# Patient Record
Sex: Male | Born: 1960 | Race: White | Hispanic: No | Marital: Married | Smoking: Former smoker
Health system: Southern US, Community
[De-identification: ages and names within clinical notes are randomized; demographics above are authoritative.]

## PROBLEM LIST (undated history)

## (undated) DIAGNOSIS — Z9889 Other specified postprocedural states: Secondary | ICD-10-CM

## (undated) DIAGNOSIS — E785 Hyperlipidemia, unspecified: Secondary | ICD-10-CM

## (undated) DIAGNOSIS — R112 Nausea with vomiting, unspecified: Secondary | ICD-10-CM

## (undated) DIAGNOSIS — K219 Gastro-esophageal reflux disease without esophagitis: Secondary | ICD-10-CM

## (undated) DIAGNOSIS — I2510 Atherosclerotic heart disease of native coronary artery without angina pectoris: Secondary | ICD-10-CM

## (undated) DIAGNOSIS — I219 Acute myocardial infarction, unspecified: Secondary | ICD-10-CM

## (undated) DIAGNOSIS — I10 Essential (primary) hypertension: Secondary | ICD-10-CM

## (undated) HISTORY — DX: Acute myocardial infarction, unspecified: I21.9

## (undated) HISTORY — DX: Hyperlipidemia, unspecified: E78.5

## (undated) HISTORY — PX: CARDIAC CATHETERIZATION: SHX172

## (undated) HISTORY — DX: Essential (primary) hypertension: I10

---

## 2019-09-19 ENCOUNTER — Inpatient Hospital Stay (HOSPITAL_COMMUNITY): Payer: 59 | Admitting: Anesthesiology

## 2019-09-19 ENCOUNTER — Encounter (HOSPITAL_COMMUNITY)
Admission: EM | Disposition: A | Discharge status: Home / Self Care | Payer: Self-pay | Source: Home / Self Care | Attending: Cardiothoracic Surgery

## 2019-09-19 ENCOUNTER — Inpatient Hospital Stay (HOSPITAL_COMMUNITY)
Admission: EM | Admit: 2019-09-19 | Discharge: 2019-09-24 | DRG: 233 | Disposition: A | Discharge status: Home / Self Care | Payer: 59 | Attending: Cardiothoracic Surgery | Admitting: Cardiothoracic Surgery

## 2019-09-19 ENCOUNTER — Emergency Department (HOSPITAL_COMMUNITY): Payer: 59

## 2019-09-19 ENCOUNTER — Other Ambulatory Visit: Payer: Self-pay

## 2019-09-19 ENCOUNTER — Inpatient Hospital Stay (HOSPITAL_COMMUNITY): Payer: 59

## 2019-09-19 ENCOUNTER — Encounter (HOSPITAL_COMMUNITY): Payer: Self-pay | Admitting: *Deleted

## 2019-09-19 DIAGNOSIS — I25110 Atherosclerotic heart disease of native coronary artery with unstable angina pectoris: Secondary | ICD-10-CM

## 2019-09-19 DIAGNOSIS — R030 Elevated blood-pressure reading, without diagnosis of hypertension: Secondary | ICD-10-CM | POA: Diagnosis present

## 2019-09-19 DIAGNOSIS — I213 ST elevation (STEMI) myocardial infarction of unspecified site: Secondary | ICD-10-CM

## 2019-09-19 DIAGNOSIS — I2510 Atherosclerotic heart disease of native coronary artery without angina pectoris: Secondary | ICD-10-CM | POA: Diagnosis present

## 2019-09-19 DIAGNOSIS — E7800 Pure hypercholesterolemia, unspecified: Secondary | ICD-10-CM | POA: Diagnosis present

## 2019-09-19 DIAGNOSIS — I5021 Acute systolic (congestive) heart failure: Secondary | ICD-10-CM | POA: Diagnosis present

## 2019-09-19 DIAGNOSIS — Z8249 Family history of ischemic heart disease and other diseases of the circulatory system: Secondary | ICD-10-CM

## 2019-09-19 DIAGNOSIS — Z20822 Contact with and (suspected) exposure to covid-19: Secondary | ICD-10-CM | POA: Diagnosis present

## 2019-09-19 DIAGNOSIS — Z9689 Presence of other specified functional implants: Secondary | ICD-10-CM

## 2019-09-19 DIAGNOSIS — Z885 Allergy status to narcotic agent status: Secondary | ICD-10-CM | POA: Diagnosis not present

## 2019-09-19 DIAGNOSIS — D62 Acute posthemorrhagic anemia: Secondary | ICD-10-CM | POA: Diagnosis not present

## 2019-09-19 DIAGNOSIS — E669 Obesity, unspecified: Secondary | ICD-10-CM | POA: Diagnosis present

## 2019-09-19 DIAGNOSIS — E1165 Type 2 diabetes mellitus with hyperglycemia: Secondary | ICD-10-CM | POA: Diagnosis present

## 2019-09-19 DIAGNOSIS — I2102 ST elevation (STEMI) myocardial infarction involving left anterior descending coronary artery: Secondary | ICD-10-CM | POA: Diagnosis present

## 2019-09-19 DIAGNOSIS — Z951 Presence of aortocoronary bypass graft: Secondary | ICD-10-CM

## 2019-09-19 DIAGNOSIS — I2109 ST elevation (STEMI) myocardial infarction involving other coronary artery of anterior wall: Secondary | ICD-10-CM | POA: Diagnosis not present

## 2019-09-19 DIAGNOSIS — R739 Hyperglycemia, unspecified: Secondary | ICD-10-CM | POA: Diagnosis not present

## 2019-09-19 DIAGNOSIS — Z09 Encounter for follow-up examination after completed treatment for conditions other than malignant neoplasm: Secondary | ICD-10-CM

## 2019-09-19 HISTORY — PX: CORONARY ARTERY BYPASS GRAFT: SHX141

## 2019-09-19 HISTORY — PX: IABP INSERTION: CATH118242

## 2019-09-19 HISTORY — PX: TEE WITHOUT CARDIOVERSION: SHX5443

## 2019-09-19 HISTORY — PX: CORONARY BALLOON ANGIOPLASTY: CATH118233

## 2019-09-19 HISTORY — PX: LEFT HEART CATH AND CORONARY ANGIOGRAPHY: CATH118249

## 2019-09-19 HISTORY — PX: ENDOVEIN HARVEST OF GREATER SAPHENOUS VEIN: SHX5059

## 2019-09-19 LAB — CBC
HCT: 48.9 % (ref 39.0–52.0)
HCT: 36.6 % — ABNORMAL LOW (ref 39.0–52.0)
HCT: 33.8 % — ABNORMAL LOW (ref 39.0–52.0)
Hemoglobin: 16.1 g/dL (ref 13.0–17.0)
Hemoglobin: 12.4 g/dL — ABNORMAL LOW (ref 13.0–17.0)
Hemoglobin: 11.5 g/dL — ABNORMAL LOW (ref 13.0–17.0)
MCH: 30.3 pg (ref 26.0–34.0)
MCH: 31.2 pg (ref 26.0–34.0)
MCH: 31.3 pg (ref 26.0–34.0)
MCHC: 32.9 g/dL (ref 30.0–36.0)
MCHC: 33.9 g/dL (ref 30.0–36.0)
MCHC: 34 g/dL (ref 30.0–36.0)
MCV: 91.9 fL (ref 80.0–100.0)
MCV: 92.2 fL (ref 80.0–100.0)
MCV: 91.8 fL (ref 80.0–100.0)
Platelets: 301 10*3/uL (ref 150–400)
Platelets: 173 10*3/uL (ref 150–400)
Platelets: 144 10*3/uL — ABNORMAL LOW (ref 150–400)
RBC: 5.32 MIL/uL (ref 4.22–5.81)
RBC: 3.97 MIL/uL — ABNORMAL LOW (ref 4.22–5.81)
RBC: 3.68 MIL/uL — ABNORMAL LOW (ref 4.22–5.81)
RDW: 12.3 % (ref 11.5–15.5)
RDW: 12.4 % (ref 11.5–15.5)
RDW: 12.5 % (ref 11.5–15.5)
WBC: 8.1 10*3/uL (ref 4.0–10.5)
WBC: 16.5 10*3/uL — ABNORMAL HIGH (ref 4.0–10.5)
WBC: 13 10*3/uL — ABNORMAL HIGH (ref 4.0–10.5)
nRBC: 0 % (ref 0.0–0.2)
nRBC: 0 % (ref 0.0–0.2)
nRBC: 0 % (ref 0.0–0.2)

## 2019-09-19 LAB — POCT I-STAT 7, (LYTES, BLD GAS, ICA,H+H)
Acid-base deficit: 4 mmol/L — ABNORMAL HIGH (ref 0.0–2.0)
Acid-base deficit: 4 mmol/L — ABNORMAL HIGH (ref 0.0–2.0)
Acid-base deficit: 3 mmol/L — ABNORMAL HIGH (ref 0.0–2.0)
Acid-base deficit: 4 mmol/L — ABNORMAL HIGH (ref 0.0–2.0)
Bicarbonate: 22.2 mmol/L (ref 20.0–28.0)
Bicarbonate: 21.9 mmol/L (ref 20.0–28.0)
Bicarbonate: 22.4 mmol/L (ref 20.0–28.0)
Bicarbonate: 21.5 mmol/L (ref 20.0–28.0)
Calcium, Ion: 1.09 mmol/L — ABNORMAL LOW (ref 1.15–1.40)
Calcium, Ion: 1.08 mmol/L — ABNORMAL LOW (ref 1.15–1.40)
Calcium, Ion: 1.05 mmol/L — ABNORMAL LOW (ref 1.15–1.40)
Calcium, Ion: 1.07 mmol/L — ABNORMAL LOW (ref 1.15–1.40)
HCT: 32 % — ABNORMAL LOW (ref 39.0–52.0)
HCT: 36 % — ABNORMAL LOW (ref 39.0–52.0)
HCT: 33 % — ABNORMAL LOW (ref 39.0–52.0)
HCT: 31 % — ABNORMAL LOW (ref 39.0–52.0)
Hemoglobin: 10.9 g/dL — ABNORMAL LOW (ref 13.0–17.0)
Hemoglobin: 12.2 g/dL — ABNORMAL LOW (ref 13.0–17.0)
Hemoglobin: 11.2 g/dL — ABNORMAL LOW (ref 13.0–17.0)
Hemoglobin: 10.5 g/dL — ABNORMAL LOW (ref 13.0–17.0)
O2 Saturation: 94 %
O2 Saturation: 98 %
O2 Saturation: 96 %
O2 Saturation: 97 %
Patient temperature: 36.9
Patient temperature: 36.9
Patient temperature: 37
Patient temperature: 37
Potassium: 4.3 mmol/L (ref 3.5–5.1)
Potassium: 3.9 mmol/L (ref 3.5–5.1)
Potassium: 4 mmol/L (ref 3.5–5.1)
Potassium: 3.6 mmol/L (ref 3.5–5.1)
Sodium: 139 mmol/L (ref 135–145)
Sodium: 141 mmol/L (ref 135–145)
Sodium: 142 mmol/L (ref 135–145)
Sodium: 141 mmol/L (ref 135–145)
TCO2: 24 mmol/L (ref 22–32)
TCO2: 23 mmol/L (ref 22–32)
TCO2: 24 mmol/L (ref 22–32)
TCO2: 23 mmol/L (ref 22–32)
pCO2 arterial: 43.1 mmHg (ref 32.0–48.0)
pCO2 arterial: 41.3 mmHg (ref 32.0–48.0)
pCO2 arterial: 42.5 mmHg (ref 32.0–48.0)
pCO2 arterial: 40.2 mmHg (ref 32.0–48.0)
pH, Arterial: 7.32 — ABNORMAL LOW (ref 7.350–7.450)
pH, Arterial: 7.331 — ABNORMAL LOW (ref 7.350–7.450)
pH, Arterial: 7.33 — ABNORMAL LOW (ref 7.350–7.450)
pH, Arterial: 7.337 — ABNORMAL LOW (ref 7.350–7.450)
pO2, Arterial: 76 mmHg — ABNORMAL LOW (ref 83.0–108.0)
pO2, Arterial: 115 mmHg — ABNORMAL HIGH (ref 83.0–108.0)
pO2, Arterial: 85 mmHg (ref 83.0–108.0)
pO2, Arterial: 102 mmHg (ref 83.0–108.0)

## 2019-09-19 LAB — LIPID PANEL
Cholesterol: 203 mg/dL — ABNORMAL HIGH (ref 0–200)
HDL: 43 mg/dL (ref >40)
LDL Cholesterol: 131 mg/dL — ABNORMAL HIGH (ref 0–99)
Total CHOL/HDL Ratio: 4.7 RATIO
Triglycerides: 146 mg/dL (ref <150)
VLDL: 29 mg/dL (ref 0–40)

## 2019-09-19 LAB — BASIC METABOLIC PANEL
Anion gap: 14 (ref 5–15)
Anion gap: 8 (ref 5–15)
BUN: 16 mg/dL (ref 6–20)
BUN: 12 mg/dL (ref 6–20)
CO2: 21 mmol/L — ABNORMAL LOW (ref 22–32)
CO2: 20 mmol/L — ABNORMAL LOW (ref 22–32)
Calcium: 9.1 mg/dL (ref 8.9–10.3)
Calcium: 7 mg/dL — ABNORMAL LOW (ref 8.9–10.3)
Chloride: 105 mmol/L (ref 98–111)
Chloride: 111 mmol/L (ref 98–111)
Creatinine, Ser: 1 mg/dL (ref 0.61–1.24)
Creatinine, Ser: 0.89 mg/dL (ref 0.61–1.24)
GFR calc Af Amer: >60 mL/min (ref >60)
GFR calc Af Amer: >60 mL/min (ref >60)
GFR calc non Af Amer: >60 mL/min (ref >60)
GFR calc non Af Amer: >60 mL/min (ref >60)
Glucose, Bld: 154 mg/dL — ABNORMAL HIGH (ref 70–99)
Glucose, Bld: 139 mg/dL — ABNORMAL HIGH (ref 70–99)
Potassium: 3.5 mmol/L (ref 3.5–5.1)
Potassium: 4.3 mmol/L (ref 3.5–5.1)
Sodium: 140 mmol/L (ref 135–145)
Sodium: 139 mmol/L (ref 135–145)

## 2019-09-19 LAB — TROPONIN I (HIGH SENSITIVITY): Troponin I (High Sensitivity): 41 ng/L — ABNORMAL HIGH (ref <18)

## 2019-09-19 LAB — TYPE AND SCREEN
ABO/RH(D): AB POS
Antibody Screen: NEGATIVE

## 2019-09-19 LAB — ABO/RH: ABO/RH(D): AB POS

## 2019-09-19 LAB — GLUCOSE, CAPILLARY
Glucose-Capillary: 150 mg/dL — ABNORMAL HIGH (ref 70–99)
Glucose-Capillary: 150 mg/dL — ABNORMAL HIGH (ref 70–99)
Glucose-Capillary: 145 mg/dL — ABNORMAL HIGH (ref 70–99)
Glucose-Capillary: 139 mg/dL — ABNORMAL HIGH (ref 70–99)
Glucose-Capillary: 144 mg/dL — ABNORMAL HIGH (ref 70–99)
Glucose-Capillary: 146 mg/dL — ABNORMAL HIGH (ref 70–99)
Glucose-Capillary: 131 mg/dL — ABNORMAL HIGH (ref 70–99)
Glucose-Capillary: 132 mg/dL — ABNORMAL HIGH (ref 70–99)
Glucose-Capillary: 143 mg/dL — ABNORMAL HIGH (ref 70–99)

## 2019-09-19 LAB — HEMOGLOBIN A1C
Hgb A1c MFr Bld: 6.3 % — ABNORMAL HIGH (ref 4.8–5.6)
Mean Plasma Glucose: 134.11 mg/dL

## 2019-09-19 LAB — MAGNESIUM: Magnesium: 2.8 mg/dL — ABNORMAL HIGH (ref 1.7–2.4)

## 2019-09-19 LAB — PROTIME-INR
INR: 1 (ref 0.8–1.2)
INR: 1.4 — ABNORMAL HIGH (ref 0.8–1.2)
Prothrombin Time: 12.5 seconds (ref 11.4–15.2)
Prothrombin Time: 17 seconds — ABNORMAL HIGH (ref 11.4–15.2)

## 2019-09-19 LAB — HEMOGLOBIN AND HEMATOCRIT, BLOOD
HCT: 25.3 % — ABNORMAL LOW (ref 39.0–52.0)
Hemoglobin: 8.5 g/dL — ABNORMAL LOW (ref 13.0–17.0)

## 2019-09-19 LAB — POCT ACTIVATED CLOTTING TIME: Activated Clotting Time: 263 seconds

## 2019-09-19 LAB — SARS CORONAVIRUS 2 BY RT PCR (HOSPITAL ORDER, PERFORMED IN ~~LOC~~ HOSPITAL LAB): SARS Coronavirus 2: NEGATIVE

## 2019-09-19 LAB — PLATELET COUNT: Platelets: 157 10*3/uL (ref 150–400)

## 2019-09-19 LAB — APTT
aPTT: 25 seconds (ref 24–36)
aPTT: 25 seconds (ref 24–36)

## 2019-09-19 SURGERY — CORONARY ARTERY BYPASS GRAFTING (CABG)
Anesthesia: General | Site: Leg Upper | Laterality: Right

## 2019-09-19 SURGERY — LEFT HEART CATH AND CORONARY ANGIOGRAPHY
Anesthesia: LOCAL

## 2019-09-19 MED ORDER — TRANEXAMIC ACID (OHS) PUMP PRIME SOLUTION
2.0000 mg/kg | INTRAVENOUS | Status: DC
  Filled 2019-09-19: qty 2.07

## 2019-09-19 MED ORDER — LACTATED RINGERS IV SOLN: INTRAVENOUS | Status: DC

## 2019-09-19 MED ORDER — SODIUM CHLORIDE 0.9% FLUSH
3.0000 mL | INTRAVENOUS | Status: DC | PRN
  Administered 2019-09-20: 3 mL via INTRAVENOUS

## 2019-09-19 MED ORDER — FENTANYL CITRATE (PF) 100 MCG/2ML IJ SOLN
INTRAMUSCULAR | Status: DC | PRN
  Administered 2019-09-19 (×2): 25 ug via INTRAVENOUS

## 2019-09-19 MED ORDER — SODIUM CHLORIDE 0.9 % IV SOLN
1.5000 g | INTRAVENOUS | Status: AC
  Administered 2019-09-19: 1.5 g via INTRAVENOUS
  Filled 2019-09-19: qty 1.5

## 2019-09-19 MED ORDER — HEPARIN (PORCINE) IN NACL 1000-0.9 UT/500ML-% IV SOLN
INTRAVENOUS | Status: AC
  Filled 2019-09-19: qty 500

## 2019-09-19 MED ORDER — ACETAMINOPHEN 160 MG/5ML PO SOLN: 650.0000 mg | Freq: Once | ORAL | Status: AC

## 2019-09-19 MED ORDER — MIDAZOLAM HCL 2 MG/2ML IJ SOLN
INTRAMUSCULAR | Status: AC
  Filled 2019-09-19: qty 2

## 2019-09-19 MED ORDER — VANCOMYCIN HCL 1500 MG/300ML IV SOLN
1500.0000 mg | INTRAVENOUS | Status: AC
  Administered 2019-09-19: 1500 mg via INTRAVENOUS
  Filled 2019-09-19: qty 300

## 2019-09-19 MED ORDER — HEPARIN (PORCINE) IN NACL 1000-0.9 UT/500ML-% IV SOLN
INTRAVENOUS | Status: DC | PRN
  Administered 2019-09-19 (×2): 500 mL

## 2019-09-19 MED ORDER — DEXMEDETOMIDINE HCL IN NACL 200 MCG/50ML IV SOLN
INTRAVENOUS | Status: AC
  Filled 2019-09-19: qty 50

## 2019-09-19 MED ORDER — SODIUM CHLORIDE 0.9 % IV SOLN: 250.0000 mL | INTRAVENOUS | Status: DC | PRN

## 2019-09-19 MED ORDER — NOREPINEPHRINE 4 MG/250ML-% IV SOLN
0.0000 ug/min | INTRAVENOUS | Status: DC
  Filled 2019-09-19: qty 250

## 2019-09-19 MED ORDER — FENTANYL CITRATE (PF) 100 MCG/2ML IJ SOLN
100.0000 ug | INTRAMUSCULAR | Status: DC | PRN
  Administered 2019-09-19 – 2019-09-20 (×10): 100 ug via INTRAVENOUS
  Filled 2019-09-19 (×10): qty 2

## 2019-09-19 MED ORDER — MILRINONE LACTATE IN DEXTROSE 20-5 MG/100ML-% IV SOLN
0.2000 ug/kg/min | INTRAVENOUS | Status: DC
  Administered 2019-09-19 – 2019-09-20 (×3): 0.3 ug/kg/min via INTRAVENOUS
  Administered 2019-09-21: 0.2 ug/kg/min via INTRAVENOUS
  Administered 2019-09-21: 0.3 ug/kg/min via INTRAVENOUS
  Filled 2019-09-19 (×5): qty 100

## 2019-09-19 MED ORDER — HEPARIN (PORCINE) IN NACL 1000-0.9 UT/500ML-% IV SOLN
INTRAVENOUS | Status: DC | PRN
  Administered 2019-09-19: 500 mL

## 2019-09-19 MED ORDER — SODIUM CHLORIDE 0.9 % IV SOLN
INTRAVENOUS | Status: DC
  Filled 2019-09-19: qty 30

## 2019-09-19 MED ORDER — LIDOCAINE HCL (PF) 1 % IJ SOLN
INTRAMUSCULAR | Status: AC
  Filled 2019-09-19: qty 30

## 2019-09-19 MED ORDER — SODIUM CHLORIDE 0.9% FLUSH
3.0000 mL | Freq: Two times a day (BID) | INTRAVENOUS | Status: DC
  Administered 2019-09-19 – 2019-09-20 (×2): 3 mL via INTRAVENOUS

## 2019-09-19 MED ORDER — NITROGLYCERIN 0.4 MG SL SUBL
SUBLINGUAL_TABLET | SUBLINGUAL | Status: AC
  Administered 2019-09-19: 0.4 mg via SUBLINGUAL
  Filled 2019-09-19: qty 1

## 2019-09-19 MED ORDER — METOPROLOL TARTRATE 5 MG/5ML IV SOLN: 2.5000 mg | INTRAVENOUS | Status: DC | PRN

## 2019-09-19 MED ORDER — ACETAMINOPHEN 500 MG PO TABS
1000.0000 mg | ORAL_TABLET | Freq: Four times a day (QID) | ORAL | Status: DC
  Administered 2019-09-20 – 2019-09-22 (×8): 1000 mg via ORAL
  Filled 2019-09-19 (×8): qty 2

## 2019-09-19 MED ORDER — SODIUM CHLORIDE 0.45 % IV SOLN: INTRAVENOUS | Status: DC | PRN

## 2019-09-19 MED ORDER — HEPARIN (PORCINE) IN NACL 1000-0.9 UT/500ML-% IV SOLN
INTRAVENOUS | Status: AC
  Filled 2019-09-19: qty 1000

## 2019-09-19 MED ORDER — SODIUM CHLORIDE 0.9 % IV SOLN: 250.0000 mL | INTRAVENOUS | Status: DC

## 2019-09-19 MED ORDER — MORPHINE SULFATE (PF) 2 MG/ML IV SOLN
1.0000 mg | INTRAVENOUS | Status: DC | PRN
  Administered 2019-09-19: 4 mg via INTRAVENOUS
  Administered 2019-09-19 (×2): 2 mg via INTRAVENOUS
  Filled 2019-09-19: qty 1
  Filled 2019-09-19: qty 2
  Filled 2019-09-19: qty 1

## 2019-09-19 MED ORDER — BISACODYL 5 MG PO TBEC
10.0000 mg | DELAYED_RELEASE_TABLET | Freq: Every day | ORAL | Status: DC
  Administered 2019-09-20 – 2019-09-21 (×2): 10 mg via ORAL
  Filled 2019-09-19 (×2): qty 2

## 2019-09-19 MED ORDER — ROCURONIUM BROMIDE 10 MG/ML (PF) SYRINGE
PREFILLED_SYRINGE | INTRAVENOUS | Status: AC
  Filled 2019-09-19: qty 10

## 2019-09-19 MED ORDER — ACETAMINOPHEN 650 MG RE SUPP
650.0000 mg | Freq: Once | RECTAL | Status: AC
  Administered 2019-09-19: 650 mg via RECTAL

## 2019-09-19 MED ORDER — MIDAZOLAM HCL (PF) 5 MG/ML IJ SOLN
INTRAMUSCULAR | Status: DC | PRN
  Administered 2019-09-19 (×2): 2 mg via INTRAVENOUS

## 2019-09-19 MED ORDER — METOPROLOL TARTRATE 12.5 MG HALF TABLET
12.5000 mg | ORAL_TABLET | Freq: Two times a day (BID) | ORAL | Status: DC
  Administered 2019-09-19: 12.5 mg via ORAL
  Filled 2019-09-19: qty 1

## 2019-09-19 MED ORDER — PHENYLEPHRINE HCL-NACL 10-0.9 MG/250ML-% IV SOLN
INTRAVENOUS | Status: DC | PRN
  Administered 2019-09-19: 50 ug/min via INTRAVENOUS

## 2019-09-19 MED ORDER — HEPARIN SODIUM (PORCINE) 1000 UNIT/ML IJ SOLN
INTRAMUSCULAR | Status: DC | PRN
  Administered 2019-09-19: 3000 [IU] via INTRAVENOUS

## 2019-09-19 MED ORDER — TRANEXAMIC ACID (OHS) BOLUS VIA INFUSION
15.0000 mg/kg | INTRAVENOUS | Status: AC
  Administered 2019-09-19: 1551 mg via INTRAVENOUS
  Filled 2019-09-19: qty 1551

## 2019-09-19 MED ORDER — ASPIRIN 81 MG PO CHEW: 324.0000 mg | CHEWABLE_TABLET | Freq: Every day | ORAL | Status: DC

## 2019-09-19 MED ORDER — SODIUM CHLORIDE 0.9 % IV SOLN: INTRAVENOUS | Status: AC

## 2019-09-19 MED ORDER — LACTATED RINGERS IV SOLN: 500.0000 mL | Freq: Once | INTRAVENOUS | Status: DC | PRN

## 2019-09-19 MED ORDER — TRANEXAMIC ACID 1000 MG/10ML IV SOLN
1.5000 mg/kg/h | INTRAVENOUS | Status: AC
  Administered 2019-09-19: 1.5 mg/kg/h via INTRAVENOUS
  Filled 2019-09-19: qty 25

## 2019-09-19 MED ORDER — SODIUM CHLORIDE 0.9 % IV SOLN
INTRAVENOUS | Status: AC | PRN
  Administered 2019-09-19: 100 mL/h via INTRAVENOUS

## 2019-09-19 MED ORDER — INSULIN REGULAR(HUMAN) IN NACL 100-0.9 UT/100ML-% IV SOLN
INTRAVENOUS | Status: AC
  Administered 2019-09-19: 1.5 [IU]/h via INTRAVENOUS
  Filled 2019-09-19: qty 100

## 2019-09-19 MED ORDER — CHLORHEXIDINE GLUCONATE CLOTH 2 % EX PADS
6.0000 | MEDICATED_PAD | Freq: Every morning | CUTANEOUS | Status: DC
  Administered 2019-09-20 – 2019-09-21 (×2): 6 via TOPICAL

## 2019-09-19 MED ORDER — HYDRALAZINE HCL 20 MG/ML IJ SOLN: 10.0000 mg | INTRAMUSCULAR | Status: AC | PRN

## 2019-09-19 MED ORDER — LACTATED RINGERS IV SOLN: INTRAVENOUS | Status: DC | PRN

## 2019-09-19 MED ORDER — DOCUSATE SODIUM 100 MG PO CAPS
200.0000 mg | ORAL_CAPSULE | Freq: Every day | ORAL | Status: DC
  Administered 2019-09-20 – 2019-09-22 (×3): 200 mg via ORAL
  Filled 2019-09-19 (×3): qty 2

## 2019-09-19 MED ORDER — ASPIRIN EC 325 MG PO TBEC
325.0000 mg | DELAYED_RELEASE_TABLET | Freq: Every day | ORAL | Status: DC
  Administered 2019-09-20 – 2019-09-22 (×3): 325 mg via ORAL
  Filled 2019-09-19 (×3): qty 1

## 2019-09-19 MED ORDER — NITROGLYCERIN IN D5W 200-5 MCG/ML-% IV SOLN: 0.0000 ug/min | INTRAVENOUS | Status: DC

## 2019-09-19 MED ORDER — ACETAMINOPHEN 325 MG PO TABS: 650.0000 mg | ORAL_TABLET | ORAL | Status: DC | PRN

## 2019-09-19 MED ORDER — HEPARIN SODIUM (PORCINE) 1000 UNIT/ML IJ SOLN
INTRAMUSCULAR | Status: AC
  Filled 2019-09-19: qty 1

## 2019-09-19 MED ORDER — PHENYLEPHRINE HCL-NACL 20-0.9 MG/250ML-% IV SOLN
30.0000 ug/min | INTRAVENOUS | Status: AC
  Administered 2019-09-19: 50 ug/min via INTRAVENOUS
  Filled 2019-09-19: qty 250

## 2019-09-19 MED ORDER — HEPARIN SODIUM (PORCINE) 1000 UNIT/ML IJ SOLN
INTRAMUSCULAR | Status: DC | PRN
  Administered 2019-09-19: 26000 [IU] via INTRAVENOUS

## 2019-09-19 MED ORDER — POTASSIUM CHLORIDE 2 MEQ/ML IV SOLN
80.0000 meq | INTRAVENOUS | Status: DC
  Filled 2019-09-19: qty 40

## 2019-09-19 MED ORDER — HEPARIN SODIUM (PORCINE) 1000 UNIT/ML IJ SOLN
INTRAMUSCULAR | Status: AC
  Filled 2019-09-19: qty 2

## 2019-09-19 MED ORDER — DEXMEDETOMIDINE HCL IN NACL 400 MCG/100ML IV SOLN
0.1000 ug/kg/h | INTRAVENOUS | Status: AC
  Administered 2019-09-19: .7 ug/kg/h via INTRAVENOUS
  Filled 2019-09-19: qty 100

## 2019-09-19 MED ORDER — METOPROLOL TARTRATE 25 MG/10 ML ORAL SUSPENSION: 12.5000 mg | Freq: Two times a day (BID) | ORAL | Status: DC

## 2019-09-19 MED ORDER — DEXMEDETOMIDINE HCL IN NACL 400 MCG/100ML IV SOLN
0.0000 ug/kg/h | INTRAVENOUS | Status: DC
  Administered 2019-09-19: 0.7 ug/kg/h via INTRAVENOUS

## 2019-09-19 MED ORDER — ALBUMIN HUMAN 5 % IV SOLN
INTRAVENOUS | Status: DC | PRN
Start: 2019-09-19 — End: 2019-09-19

## 2019-09-19 MED ORDER — SODIUM CHLORIDE 0.9 % IV SOLN
750.0000 mg | INTRAVENOUS | Status: AC
  Administered 2019-09-19: 750 mg via INTRAVENOUS
  Filled 2019-09-19: qty 750

## 2019-09-19 MED ORDER — CHLORHEXIDINE GLUCONATE 0.12 % MT SOLN
15.0000 mL | OROMUCOSAL | Status: AC
  Administered 2019-09-19: 15 mL via OROMUCOSAL

## 2019-09-19 MED ORDER — ONDANSETRON HCL 4 MG/2ML IJ SOLN
4.0000 mg | Freq: Four times a day (QID) | INTRAMUSCULAR | Status: DC | PRN
  Administered 2019-09-19 – 2019-09-21 (×6): 4 mg via INTRAVENOUS
  Filled 2019-09-19 (×6): qty 2

## 2019-09-19 MED ORDER — DEXTROSE 50 % IV SOLN: 0.0000 mL | INTRAVENOUS | Status: DC | PRN

## 2019-09-19 MED ORDER — PROTAMINE SULFATE 10 MG/ML IV SOLN
INTRAVENOUS | Status: AC
  Filled 2019-09-19: qty 25

## 2019-09-19 MED ORDER — NITROGLYCERIN 0.4 MG SL SUBL
0.4000 mg | SUBLINGUAL_TABLET | SUBLINGUAL | Status: DC | PRN
  Administered 2019-09-19: 0.4 mg via SUBLINGUAL

## 2019-09-19 MED ORDER — PROPOFOL 10 MG/ML IV BOLUS
INTRAVENOUS | Status: DC | PRN
  Administered 2019-09-19: 120 mg via INTRAVENOUS

## 2019-09-19 MED ORDER — PLASMA-LYTE 148 IV SOLN
INTRAVENOUS | Status: DC
  Filled 2019-09-19: qty 2.5

## 2019-09-19 MED ORDER — PROTAMINE SULFATE 10 MG/ML IV SOLN
INTRAVENOUS | Status: DC | PRN
  Administered 2019-09-19: 10 mg via INTRAVENOUS
  Administered 2019-09-19 (×2): 50 mg via INTRAVENOUS
  Administered 2019-09-19: 30 mg via INTRAVENOUS
  Administered 2019-09-19 (×2): 50 mg via INTRAVENOUS

## 2019-09-19 MED ORDER — VANCOMYCIN HCL 1000 MG IV SOLR
INTRAVENOUS | Status: DC
  Filled 2019-09-19: qty 1000

## 2019-09-19 MED ORDER — ACETAMINOPHEN 160 MG/5ML PO SOLN: 1000.0000 mg | Freq: Four times a day (QID) | ORAL | Status: DC

## 2019-09-19 MED ORDER — NITROGLYCERIN 1 MG/10 ML FOR IR/CATH LAB
INTRA_ARTERIAL | Status: AC
  Filled 2019-09-19: qty 10

## 2019-09-19 MED ORDER — SUCCINYLCHOLINE CHLORIDE 20 MG/ML IJ SOLN
INTRAMUSCULAR | Status: DC | PRN
  Administered 2019-09-19: 120 mg via INTRAVENOUS

## 2019-09-19 MED ORDER — FENTANYL CITRATE (PF) 100 MCG/2ML IJ SOLN
INTRAMUSCULAR | Status: AC
  Filled 2019-09-19: qty 2

## 2019-09-19 MED ORDER — PLASMA-LYTE 148 IV SOLN
INTRAVENOUS | Status: DC | PRN
  Administered 2019-09-19: 500 mL

## 2019-09-19 MED ORDER — 0.9 % SODIUM CHLORIDE (POUR BTL) OPTIME
TOPICAL | Status: DC | PRN
  Administered 2019-09-19: 5000 mL

## 2019-09-19 MED ORDER — SUCCINYLCHOLINE CHLORIDE 200 MG/10ML IV SOSY
PREFILLED_SYRINGE | INTRAVENOUS | Status: AC
  Filled 2019-09-19: qty 10

## 2019-09-19 MED ORDER — ROCURONIUM BROMIDE 10 MG/ML (PF) SYRINGE
PREFILLED_SYRINGE | INTRAVENOUS | Status: DC | PRN
  Administered 2019-09-19: 70 mg via INTRAVENOUS
  Administered 2019-09-19: 50 mg via INTRAVENOUS
  Administered 2019-09-19: 30 mg via INTRAVENOUS
  Administered 2019-09-19 (×2): 50 mg via INTRAVENOUS

## 2019-09-19 MED ORDER — MIDAZOLAM HCL 2 MG/2ML IJ SOLN: 2.0000 mg | INTRAMUSCULAR | Status: DC | PRN

## 2019-09-19 MED ORDER — VERAPAMIL HCL 2.5 MG/ML IV SOLN
INTRAVENOUS | Status: DC | PRN
  Administered 2019-09-19: 10 mL via INTRA_ARTERIAL

## 2019-09-19 MED ORDER — LABETALOL HCL 5 MG/ML IV SOLN: 10.0000 mg | INTRAVENOUS | Status: AC | PRN

## 2019-09-19 MED ORDER — VERAPAMIL HCL 2.5 MG/ML IV SOLN
INTRAVENOUS | Status: AC
  Filled 2019-09-19: qty 2

## 2019-09-19 MED ORDER — VANCOMYCIN HCL IN DEXTROSE 1-5 GM/200ML-% IV SOLN
1000.0000 mg | Freq: Once | INTRAVENOUS | Status: AC
  Administered 2019-09-19: 1000 mg via INTRAVENOUS
  Filled 2019-09-19: qty 200

## 2019-09-19 MED ORDER — MIDAZOLAM HCL 2 MG/2ML IJ SOLN
INTRAMUSCULAR | Status: DC | PRN
  Administered 2019-09-19: 1 mg via INTRAVENOUS

## 2019-09-19 MED ORDER — EPINEPHRINE HCL 5 MG/250ML IV SOLN IN NS
0.0000 ug/min | INTRAVENOUS | Status: DC
  Filled 2019-09-19: qty 250

## 2019-09-19 MED ORDER — SODIUM CHLORIDE 0.9 % IV SOLN
1.5000 g | Freq: Two times a day (BID) | INTRAVENOUS | Status: AC
  Administered 2019-09-19 – 2019-09-21 (×4): 1.5 g via INTRAVENOUS
  Filled 2019-09-19 (×4): qty 1.5

## 2019-09-19 MED ORDER — ASPIRIN 81 MG PO CHEW
81.0000 mg | CHEWABLE_TABLET | Freq: Every day | ORAL | Status: DC
Start: 2019-09-19 — End: 2019-09-19

## 2019-09-19 MED ORDER — DOPAMINE-DEXTROSE 3.2-5 MG/ML-% IV SOLN: 2.0000 ug/kg/min | INTRAVENOUS | Status: AC

## 2019-09-19 MED ORDER — SODIUM CHLORIDE 0.9 % IV SOLN: INTRAVENOUS | Status: DC

## 2019-09-19 MED ORDER — INSULIN REGULAR(HUMAN) IN NACL 100-0.9 UT/100ML-% IV SOLN: INTRAVENOUS | Status: DC

## 2019-09-19 MED ORDER — SODIUM CHLORIDE 0.9% FLUSH: 3.0000 mL | INTRAVENOUS | Status: DC | PRN

## 2019-09-19 MED ORDER — FENTANYL CITRATE (PF) 250 MCG/5ML IJ SOLN
INTRAMUSCULAR | Status: AC
  Filled 2019-09-19: qty 5

## 2019-09-19 MED ORDER — SODIUM CHLORIDE 0.9% FLUSH
3.0000 mL | Freq: Two times a day (BID) | INTRAVENOUS | Status: DC
  Administered 2019-09-20: 3 mL via INTRAVENOUS

## 2019-09-19 MED ORDER — OXYCODONE HCL 5 MG PO TABS
5.0000 mg | ORAL_TABLET | ORAL | Status: DC | PRN
  Administered 2019-09-19 – 2019-09-20 (×4): 10 mg via ORAL
  Filled 2019-09-19 (×4): qty 2

## 2019-09-19 MED ORDER — PANTOPRAZOLE SODIUM 40 MG PO TBEC
40.0000 mg | DELAYED_RELEASE_TABLET | Freq: Every day | ORAL | Status: DC
  Administered 2019-09-21 – 2019-09-22 (×2): 40 mg via ORAL
  Filled 2019-09-19 (×2): qty 1

## 2019-09-19 MED ORDER — METOPROLOL TARTRATE 5 MG/5ML IV SOLN
INTRAVENOUS | Status: DC | PRN
  Administered 2019-09-19: 5 mg via INTRAVENOUS

## 2019-09-19 MED ORDER — SODIUM CHLORIDE 0.9% FLUSH: 3.0000 mL | Freq: Once | INTRAVENOUS | Status: DC

## 2019-09-19 MED ORDER — MILRINONE LACTATE IN DEXTROSE 20-5 MG/100ML-% IV SOLN
0.3000 ug/kg/min | INTRAVENOUS | Status: AC
  Administered 2019-09-19: .25 ug/kg/min via INTRAVENOUS
  Filled 2019-09-19: qty 100

## 2019-09-19 MED ORDER — MAGNESIUM SULFATE 50 % IJ SOLN
40.0000 meq | INTRAMUSCULAR | Status: DC
  Filled 2019-09-19: qty 9.85

## 2019-09-19 MED ORDER — POTASSIUM CHLORIDE 10 MEQ/50ML IV SOLN
10.0000 meq | INTRAVENOUS | Status: AC
  Administered 2019-09-19 (×2): 10 meq via INTRAVENOUS

## 2019-09-19 MED ORDER — TRAMADOL HCL 50 MG PO TABS
50.0000 mg | ORAL_TABLET | ORAL | Status: DC | PRN
  Administered 2019-09-21: 50 mg via ORAL
  Filled 2019-09-19: qty 1

## 2019-09-19 MED ORDER — ONDANSETRON HCL 4 MG/2ML IJ SOLN: 4.0000 mg | Freq: Four times a day (QID) | INTRAMUSCULAR | Status: DC | PRN

## 2019-09-19 MED ORDER — HEMOSTATIC AGENTS (NO CHARGE) OPTIME
TOPICAL | Status: DC | PRN
  Administered 2019-09-19 (×5): 1 via TOPICAL

## 2019-09-19 MED ORDER — FENTANYL CITRATE (PF) 250 MCG/5ML IJ SOLN
INTRAMUSCULAR | Status: AC
  Filled 2019-09-19: qty 20

## 2019-09-19 MED ORDER — PHENYLEPHRINE HCL-NACL 20-0.9 MG/250ML-% IV SOLN
0.0000 ug/min | INTRAVENOUS | Status: DC
  Filled 2019-09-19: qty 250

## 2019-09-19 MED ORDER — ASPIRIN 81 MG PO CHEW
324.0000 mg | CHEWABLE_TABLET | Freq: Once | ORAL | Status: AC
  Administered 2019-09-19: 324 mg via ORAL

## 2019-09-19 MED ORDER — MAGNESIUM SULFATE 4 GM/100ML IV SOLN
4.0000 g | Freq: Once | INTRAVENOUS | Status: AC
  Administered 2019-09-19: 4 g via INTRAVENOUS
  Filled 2019-09-19: qty 100

## 2019-09-19 MED ORDER — FAMOTIDINE IN NACL 20-0.9 MG/50ML-% IV SOLN
20.0000 mg | Freq: Two times a day (BID) | INTRAVENOUS | Status: AC
  Administered 2019-09-19 – 2019-09-20 (×2): 20 mg via INTRAVENOUS
  Filled 2019-09-19: qty 50

## 2019-09-19 MED ORDER — LIDOCAINE 2% (20 MG/ML) 5 ML SYRINGE
INTRAMUSCULAR | Status: DC | PRN
  Administered 2019-09-19: 50 mg via INTRAVENOUS

## 2019-09-19 MED ORDER — ROCURONIUM BROMIDE 10 MG/ML (PF) SYRINGE
PREFILLED_SYRINGE | INTRAVENOUS | Status: AC
  Filled 2019-09-19: qty 20

## 2019-09-19 MED ORDER — POTASSIUM CHLORIDE 10 MEQ/50ML IV SOLN: 10.0000 meq | INTRAVENOUS | Status: AC

## 2019-09-19 MED ORDER — BISACODYL 10 MG RE SUPP: 10.0000 mg | Freq: Every day | RECTAL | Status: DC

## 2019-09-19 MED ORDER — ALBUMIN HUMAN 5 % IV SOLN
250.0000 mL | INTRAVENOUS | Status: AC | PRN
  Administered 2019-09-19 – 2019-09-20 (×3): 12.5 g via INTRAVENOUS
  Filled 2019-09-19: qty 250

## 2019-09-19 MED ORDER — LIDOCAINE HCL (PF) 1 % IJ SOLN
INTRAMUSCULAR | Status: DC | PRN
  Administered 2019-09-19: 15 mL
  Administered 2019-09-19: 2 mL

## 2019-09-19 MED ORDER — HEPARIN SODIUM (PORCINE) 1000 UNIT/ML IJ SOLN
INTRAMUSCULAR | Status: DC | PRN
  Administered 2019-09-19 (×2): 3000 [IU] via INTRAVENOUS
  Administered 2019-09-19: 10000 [IU] via INTRAVENOUS

## 2019-09-19 MED ORDER — DOPAMINE-DEXTROSE 1.6-5 MG/ML-% IV SOLN
INTRAVENOUS | Status: DC | PRN
  Administered 2019-09-19: 3 ug/kg/min via INTRAVENOUS

## 2019-09-19 MED ORDER — NITROGLYCERIN IN D5W 200-5 MCG/ML-% IV SOLN
2.0000 ug/min | INTRAVENOUS | Status: AC
  Administered 2019-09-19: 16.6 ug/min via INTRAVENOUS
  Filled 2019-09-19: qty 250

## 2019-09-19 MED ORDER — SODIUM CHLORIDE 0.9 % IV SOLN
INTRAVENOUS | Status: DC
  Administered 2019-09-19: 20 mL/h via INTRAVENOUS

## 2019-09-19 MED ORDER — HEPARIN SODIUM (PORCINE) 5000 UNIT/ML IJ SOLN
4000.0000 [IU] | Freq: Once | INTRAMUSCULAR | Status: AC
  Administered 2019-09-19: 4000 [IU] via INTRAVENOUS

## 2019-09-19 MED ORDER — FENTANYL CITRATE (PF) 250 MCG/5ML IJ SOLN
INTRAMUSCULAR | Status: DC | PRN
  Administered 2019-09-19 (×5): 250 ug via INTRAVENOUS

## 2019-09-19 SURGICAL SUPPLY — 118 items
ADH SKN CLS APL DERMABOND .7 (GAUZE/BANDAGES/DRESSINGS) ×3
AGENT HMST KT MTR STRL THRMB (HEMOSTASIS) ×3
BAG DECANTER FOR FLEXI CONT (MISCELLANEOUS) ×7 IMPLANT
BLADE CLIPPER SURG (BLADE) ×5 IMPLANT
BLADE STERNUM SYSTEM 6 (BLADE) ×4 IMPLANT
BNDG ELASTIC 4X5.8 VLCR STR LF (GAUZE/BANDAGES/DRESSINGS) ×4 IMPLANT
BNDG ELASTIC 6X5.8 VLCR STR LF (GAUZE/BANDAGES/DRESSINGS) ×4 IMPLANT
BNDG GAUZE ELAST 4 BULKY (GAUZE/BANDAGES/DRESSINGS) ×4 IMPLANT
CANISTER SUCT 3000ML PPV (MISCELLANEOUS) ×4 IMPLANT
CANN PRFSN .5XCNCT 15X34-48 (MISCELLANEOUS) ×3
CANNULA ARTERIAL NVNT 3/8 20FR (MISCELLANEOUS) ×1 IMPLANT
CANNULA EZ GLIDE AORTIC 21FR (CANNULA) ×6 IMPLANT
CANNULA PRFSN .5XCNCT 15X34-48 (MISCELLANEOUS) IMPLANT
CANNULA VEN 2 STAGE (MISCELLANEOUS) ×4
CANNULA VESSEL 3MM 2 BLNT TIP (CANNULA) ×3 IMPLANT
CATH CPB KIT OWEN (MISCELLANEOUS) ×4 IMPLANT
CATH THORACIC 36FR (CATHETERS) ×3 IMPLANT
CLIP RETRACTION 3.0MM CORONARY (MISCELLANEOUS) ×4 IMPLANT
CLIP VESOCCLUDE MED 24/CT (CLIP) IMPLANT
CLIP VESOCCLUDE SM WIDE 24/CT (CLIP) IMPLANT
DERMABOND ADVANCED (GAUZE/BANDAGES/DRESSINGS) ×1
DERMABOND ADVANCED .7 DNX12 (GAUZE/BANDAGES/DRESSINGS) IMPLANT
DRAIN CHANNEL 32F RND 10.7 FF (WOUND CARE) ×6 IMPLANT
DRAPE CARDIOVASCULAR INCISE (DRAPES) ×4
DRAPE INCISE IOBAN 66X45 STRL (DRAPES) ×4 IMPLANT
DRAPE SLUSH/WARMER DISC (DRAPES) ×4 IMPLANT
DRAPE SRG 135X102X78XABS (DRAPES) ×3 IMPLANT
DRSG AQUACEL AG ADV 3.5X14 (GAUZE/BANDAGES/DRESSINGS) ×4 IMPLANT
ELECT BLADE 4.0 EZ CLEAN MEGAD (MISCELLANEOUS) ×4
ELECT REM PT RETURN 9FT ADLT (ELECTROSURGICAL) ×8
ELECTRODE BLDE 4.0 EZ CLN MEGD (MISCELLANEOUS) ×3 IMPLANT
ELECTRODE REM PT RTRN 9FT ADLT (ELECTROSURGICAL) ×6 IMPLANT
FELT TEFLON 1X6 (MISCELLANEOUS) ×7 IMPLANT
GAUZE SPONGE 4X4 12PLY STRL (GAUZE/BANDAGES/DRESSINGS) ×8 IMPLANT
GAUZE SPONGE 4X4 12PLY STRL LF (GAUZE/BANDAGES/DRESSINGS) ×2 IMPLANT
GLOVE BIO SURGEON STRL SZ 6.5 (GLOVE) ×2 IMPLANT
GLOVE BIOGEL PI IND STRL 6.5 (GLOVE) IMPLANT
GLOVE BIOGEL PI IND STRL 7.5 (GLOVE) IMPLANT
GLOVE BIOGEL PI INDICATOR 6.5 (GLOVE) ×1
GLOVE BIOGEL PI INDICATOR 7.5 (GLOVE) ×4
GLOVE ORTHO TXT STRL SZ7.5 (GLOVE) ×6 IMPLANT
GLOVE SURG SS PI 6.0 STRL IVOR (GLOVE) ×2 IMPLANT
GLOVE SURG SS PI 6.5 STRL IVOR (GLOVE) ×1 IMPLANT
GOWN STRL REUS W/ TWL LRG LVL3 (GOWN DISPOSABLE) ×12 IMPLANT
GOWN STRL REUS W/TWL LRG LVL3 (GOWN DISPOSABLE) ×28
HEMOSTAT POWDER SURGIFOAM 1G (HEMOSTASIS) ×12 IMPLANT
HEMOSTAT SURGICEL 2X14 (HEMOSTASIS) ×1 IMPLANT
INSERT FOGARTY XLG (MISCELLANEOUS) ×4 IMPLANT
KIT BASIN OR (CUSTOM PROCEDURE TRAY) ×4 IMPLANT
KIT CATH SUCT 8FR (CATHETERS) ×1 IMPLANT
KIT SUCTION CATH 14FR (SUCTIONS) ×12 IMPLANT
KIT TOURNIQUET VASCULAR (KITS) ×1 IMPLANT
KIT TURNOVER KIT B (KITS) ×4 IMPLANT
KIT VASOVIEW HEMOPRO 2 VH 4000 (KITS) ×4 IMPLANT
LEAD PACING MYOCARDI (MISCELLANEOUS) ×4 IMPLANT
MARKER GRAFT CORONARY BYPASS (MISCELLANEOUS) ×12 IMPLANT
NS IRRIG 1000ML POUR BTL (IV SOLUTION) ×20 IMPLANT
PACK E OPEN HEART (SUTURE) ×4 IMPLANT
PACK OPEN HEART (CUSTOM PROCEDURE TRAY) ×4 IMPLANT
PAD ARMBOARD 7.5X6 YLW CONV (MISCELLANEOUS) ×8 IMPLANT
PAD ELECT DEFIB RADIOL ZOLL (MISCELLANEOUS) ×4 IMPLANT
PENCIL BUTTON HOLSTER BLD 10FT (ELECTRODE) ×4 IMPLANT
POSITIONER HEAD DONUT 9IN (MISCELLANEOUS) ×4 IMPLANT
PUNCH AORTIC ROTATE 4.0MM (MISCELLANEOUS) IMPLANT
PUNCH AORTIC ROTATE 4.5MM 8IN (MISCELLANEOUS) ×1 IMPLANT
PUNCH AORTIC ROTATE 5MM 8IN (MISCELLANEOUS) IMPLANT
SET CARDIOPLEGIA MPS 5001102 (MISCELLANEOUS) ×1 IMPLANT
SOL ANTI FOG 6CC (MISCELLANEOUS) IMPLANT
SOLUTION ANTI FOG 6CC (MISCELLANEOUS)
SPONGE LAP 18X18 RF (DISPOSABLE) ×4 IMPLANT
SPONGE LAP 4X18 RFD (DISPOSABLE) IMPLANT
SUPPORT HEART JANKE-BARRON (MISCELLANEOUS) ×4 IMPLANT
SURGIFLO W/THROMBIN 8M KIT (HEMOSTASIS) ×1 IMPLANT
SUT BONE WAX W31G (SUTURE) ×4 IMPLANT
SUT ETHIBOND X763 2 0 SH 1 (SUTURE) ×8 IMPLANT
SUT MNCRL AB 3-0 PS2 18 (SUTURE) ×8 IMPLANT
SUT MNCRL AB 4-0 PS2 18 (SUTURE) IMPLANT
SUT PDS AB 1 CTX 36 (SUTURE) ×6 IMPLANT
SUT PROLENE 2 0 SH DA (SUTURE) IMPLANT
SUT PROLENE 3 0 SH DA (SUTURE) ×4 IMPLANT
SUT PROLENE 3 0 SH1 36 (SUTURE) ×1 IMPLANT
SUT PROLENE 4 0 RB 1 (SUTURE)
SUT PROLENE 4 0 SH DA (SUTURE) IMPLANT
SUT PROLENE 4 0 TF (SUTURE) ×2 IMPLANT
SUT PROLENE 4-0 RB1 .5 CRCL 36 (SUTURE) IMPLANT
SUT PROLENE 5 0 C 1 36 (SUTURE) IMPLANT
SUT PROLENE 6 0 C 1 30 (SUTURE) ×1 IMPLANT
SUT PROLENE 6 0 CC (SUTURE) ×3 IMPLANT
SUT PROLENE 7 0 BV1 MDA (SUTURE) ×1 IMPLANT
SUT PROLENE 7.0 RB 3 (SUTURE) ×12 IMPLANT
SUT PROLENE 8 0 BV175 6 (SUTURE) ×3 IMPLANT
SUT PROLENE BLUE 7 0 (SUTURE) ×4 IMPLANT
SUT PROLENE POLY MONO (SUTURE) IMPLANT
SUT SILK  1 MH (SUTURE) ×4
SUT SILK 1 MH (SUTURE) ×3 IMPLANT
SUT STEEL 6MS V (SUTURE) ×1 IMPLANT
SUT STEEL STERNAL CCS#1 18IN (SUTURE) IMPLANT
SUT STEEL SZ 6 DBL 3X14 BALL (SUTURE) ×1 IMPLANT
SUT VIC AB 1 CTX 18 (SUTURE) ×2 IMPLANT
SUT VIC AB 1 CTX 36 (SUTURE)
SUT VIC AB 1 CTX36XBRD ANBCTR (SUTURE) IMPLANT
SUT VIC AB 2-0 CT1 27 (SUTURE) ×4
SUT VIC AB 2-0 CT1 TAPERPNT 27 (SUTURE) IMPLANT
SUT VIC AB 2-0 CTX 27 (SUTURE) IMPLANT
SUT VIC AB 3-0 SH 27 (SUTURE)
SUT VIC AB 3-0 SH 27X BRD (SUTURE) IMPLANT
SUT VIC AB 3-0 X1 27 (SUTURE) IMPLANT
SUT VICRYL 4-0 PS2 18IN ABS (SUTURE) ×1 IMPLANT
SYSTEM SAHARA CHEST DRAIN ATS (WOUND CARE) ×4 IMPLANT
TAPE CLOTH SURG 4X10 WHT LF (GAUZE/BANDAGES/DRESSINGS) ×1 IMPLANT
TAPE PAPER 2X10 WHT MICROPORE (GAUZE/BANDAGES/DRESSINGS) ×1 IMPLANT
TAPE UMBILICAL COTTON 1/8X30 (MISCELLANEOUS) ×1 IMPLANT
TOWEL GREEN STERILE (TOWEL DISPOSABLE) ×5 IMPLANT
TOWEL GREEN STERILE FF (TOWEL DISPOSABLE) ×4 IMPLANT
TRAY FOLEY SLVR 16FR TEMP STAT (SET/KITS/TRAYS/PACK) ×4 IMPLANT
TUBING LAP HI FLOW INSUFFLATIO (TUBING) ×4 IMPLANT
UNDERPAD 30X36 HEAVY ABSORB (UNDERPADS AND DIAPERS) ×4 IMPLANT
WATER STERILE IRR 1000ML POUR (IV SOLUTION) ×8 IMPLANT

## 2019-09-19 SURGICAL SUPPLY — 20 items
BALLN IABP SENSA PLUS 8F 50CC (BALLOONS) ×2
BALLN SAPPHIRE 2.5X20 (BALLOONS) ×2
BALLOON IABP SENS PLUS 8F 50CC (BALLOONS) IMPLANT
BALLOON SAPPHIRE 2.5X20 (BALLOONS) IMPLANT
CATH 5FR JL3.5 JR4 ANG PIG MP (CATHETERS) ×1 IMPLANT
CATH VISTA GUIDE 6FR XBLAD3.5 (CATHETERS) ×1 IMPLANT
DEVICE RAD COMP TR BAND LRG (VASCULAR PRODUCTS) ×1 IMPLANT
GLIDESHEATH SLEND A-KIT 6F 22G (SHEATH) ×1 IMPLANT
GUIDEWIRE INQWIRE 1.5J.035X260 (WIRE) IMPLANT
INQWIRE 1.5J .035X260CM (WIRE) ×2
KIT ENCORE 26 ADVANTAGE (KITS) ×1 IMPLANT
KIT HEART LEFT (KITS) ×2 IMPLANT
PACK CARDIAC CATHETERIZATION (CUSTOM PROCEDURE TRAY) ×2 IMPLANT
SHEATH PINNACLE 5F 10CM (SHEATH) ×1 IMPLANT
SHEATH PROBE COVER 6X72 (BAG) ×1 IMPLANT
TRANSDUCER W/STOPCOCK (MISCELLANEOUS) ×2 IMPLANT
TUBING CIL FLEX 10 FLL-RA (TUBING) ×2 IMPLANT
WIRE ASAHI PROWATER 180CM (WIRE) ×1 IMPLANT
WIRE COUGAR XT STRL 190CM (WIRE) ×1 IMPLANT
WIRE EMERALD 3MM-J .035X150CM (WIRE) ×1 IMPLANT

## 2019-09-19 NOTE — ED Provider Notes (Addendum)
[facility] EMERGENCY DEPARTMENT Provider Note   CSN: [id number] Arrival date & time: 09/19/19  6720     History Chief Complaint  Patient presents with  . Chest Pain    [name] is a 59 y.o. male.  HPI     This a 59 year old male with no stated past medical history who presents with chest pain.  Patient reports onset of chest discomfort this morning around 530.  He states he was getting ready for work.  Pain worsened with ambulation into the ED.  He reports it is a pressure and nonradiating.  He had one episode of pain yesterday that was similar and self resolved.  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  He denies any past medical history or medications but does not regularly see a doctor.  Does have a family history of heart disease with his father and states that his brother had a heart attack in his early 36s.  He is a non-smoker.  Currently his pain is 5 out of 10.  Denies nausea, vomiting, recent fevers, shortness of breath.  History reviewed. No pertinent past medical history.  There are no problems to display for this patient.   History reviewed. No pertinent surgical history.     No family history on file.  Social History   Tobacco Use  . Smoking status: Not on file  Substance Use Topics  . Alcohol use: Not on file  . Drug use: Not on file    Home Medications Prior to Admission medications   Not on File    Allergies    Patient has no known allergies.  Review of Systems   Review of Systems  Constitutional: Negative for fever.  Respiratory: Negative for shortness of breath.   Cardiovascular: Positive for chest pain.  Gastrointestinal: Negative for abdominal pain, nausea and vomiting.  Genitourinary: Negative for dysuria.  All other systems reviewed and are negative.   Physical Exam Updated Vital Signs BP (!) 134/95   Pulse (!) 106   Temp 98.6 F (37 C) (Oral)   Resp (!) 26   Ht 1.803 m (5\' 11" )    Wt 103.4 kg   SpO2 99%   BMI 31.80 kg/m   Physical Exam Vitals and nursing note reviewed.  Constitutional:      Appearance: He is well-developed. He is obese. He is not ill-appearing.  HENT:     Head: Normocephalic and atraumatic.  Eyes:     Pupils: Pupils are equal, round, and reactive to light.  Cardiovascular:     Rate and Rhythm: Regular rhythm. Tachycardia present.     Heart sounds: Normal heart sounds. No murmur.  Pulmonary:     Effort: Pulmonary effort is normal. No respiratory distress.     Breath sounds: Normal breath sounds. No wheezing.  Abdominal:     General: Bowel sounds are normal.     Palpations: Abdomen is soft.     Tenderness: There is no abdominal tenderness. There is no rebound.  Musculoskeletal:     Cervical back: Neck supple.     Right lower leg: No tenderness. No edema.     Left lower leg: No tenderness. No edema.  Skin:    General: Skin is warm and dry.  Neurological:     Mental Status: He is alert and oriented to person, place, and time.  Psychiatric:        Mood and Affect: Mood normal.  ED Results / Procedures / Treatments   Labs (all labs ordered are listed, but only abnormal results are displayed) Labs Reviewed  SARS CORONAVIRUS 2 BY RT PCR (HOSPITAL ORDER, PERFORMED IN [facility])  CBC  BASIC METABOLIC PANEL  HEMOGLOBIN A1C  PROTIME-INR  APTT  LIPID PANEL  TROPONIN I (HIGH SENSITIVITY)    EKG EKG Interpretation  Date/Time:  Tuesday Sep 19 2019 09:23:30 EDT Ventricular Rate:  102 PR Interval:  188 QRS Duration: 100 QT Interval:  356 QTC Calculation: 463 R Axis:   -16 Text Interpretation: Sinus tachycardia Marked ST abnormality, possible inferior subendocardial injury Abnormal ECG ** ** ACUTE MI / STEMI ** ** Elevation AVR, V1, V2 Depressions inferiorly and laterally Confirmed by Ross Marcus ([id number]) on 09/19/2019 7:07:46 AM   Radiology DG Chest Port 1 View  Result Date: 09/19/2019 CLINICAL DATA:   Chest pressure, worse this morning EXAM: PORTABLE CHEST 1 VIEW COMPARISON:  None FINDINGS: Mild nonspecific elevation of the right hemidiaphragm. Slight streaky basilar opacities likely reflecting atelectasis. No consolidation, features of edema, pneumothorax, or effusion. The cardiomediastinal contours are unremarkable. No acute osseous or soft tissue abnormality. Telemetry leads and pacer pads overlie the chest. IMPRESSION: Minimal basilar atelectasis. No other acute cardiopulmonary abnormality. Electronically Signed   By: Kreg Shropshire M.D.   On: 09/19/2019 06:57    Procedures Procedures (including critical care time)  CRITICAL CARE Performed by: Shon Baton   Total critical care time: 45 minutes  Critical care time was exclusive of separately billable procedures and treating other patients.  Critical care was necessary to treat or prevent imminent or life-threatening deterioration.  Critical care was time spent personally by me on the following activities: development of treatment plan with patient and/or surrogate as well as nursing, discussions with consultants, evaluation of patient's response to treatment, examination of patient, obtaining history from patient or surrogate, ordering and performing treatments and interventions, ordering and review of laboratory studies, ordering and review of radiographic studies, pulse oximetry and re-evaluation of patient's condition.   Medications Ordered in ED Medications  sodium chloride flush (NS) 0.9 % injection 3 mL (has no administration in time range)  0.9 %  sodium chloride infusion (20 mL/hr Intravenous New Bag/Given 09/19/19 0641)  nitroGLYCERIN (NITROSTAT) SL tablet 0.4 mg (0.4 mg Sublingual Given 09/19/19 0650)  aspirin chewable tablet 324 mg (324 mg Oral Given 09/19/19 0637)  heparin injection 4,000 Units (4,000 Units Intravenous Given 09/19/19 6333)    ED Course  I have reviewed the triage vital signs and the nursing  notes.  Pertinent labs & imaging results that were available during my care of the patient were reviewed by me and considered in my medical decision making (see chart for details).    MDM Rules/Calculators/A&P                      Patient presents with chest pain.  He is nontoxic-appearing.  Slightly tachycardic in the low 100s.  Initially significantly hypertensive.  EKG showed is remarkable for ST elevations in aVR, V1, V2 and contiguous ST depressions in inferior and lateral leads.  Given that he is having active pain, code STEMI was activated.  Patient was given 4000 units of heparin, aspirin, and sublingual nitroglycerin.  Pain did improve some to a 3 out of 10 following nitroglycerin.  Work-up initiated including troponin.  Discussed with Dr. Garnette Scheuermann, cardiology.  He is on his way to the hospital and Cath Lab has been  activated.  Patient remained hemodynamically stable on cardiac monitoring while in the emergency room.  Cardiac monitoring shows persistent ST changes but no dynamic changes.  Chest x-ray reviewed independently by myself.  No evidence of mediastinal widening to suggest dissection, no pneumothorax or pneumonia.  Covid testing is pending.  Final Clinical Impression(s) / ED Diagnoses Final diagnoses:  ST elevation myocardial infarction (STEMI), unspecified artery [facility])    Rx / DC Orders ED Discharge Orders    None       Johnna Bollier, Barbette Hair, MD 09/19/19 1610    Merryl Hacker, MD 09/25/19 [phone]

## 2019-09-19 NOTE — Anesthesia Procedure Notes (Signed)
Central Venous Catheter Insertion Performed by: Kipp Brood, MD, anesthesiologist Start/End5/25/2021 9:50 AM, 09/19/2019 10:00 AM Patient location: Pre-op. Preanesthetic checklist: patient identified, IV checked, site marked, risks and benefits discussed, surgical consent, monitors and equipment checked, pre-op evaluation, timeout performed and anesthesia consent Lidocaine 1% used for infiltration and patient sedated Hand hygiene performed  and maximum sterile barriers used  Catheter size: 9 Fr Sheath introducer Procedure performed using ultrasound guided technique. Ultrasound Notes:anatomy identified, needle tip was noted to be adjacent to the nerve/plexus identified, no ultrasound evidence of intravascular and/or intraneural injection and image(s) printed for medical record Attempts: 1 Following insertion, line sutured and dressing applied. Post procedure assessment: blood return through all ports, free fluid flow and no air  Patient tolerated the procedure well with no immediate complications.

## 2019-09-19 NOTE — Anesthesia Preprocedure Evaluation (Signed)
Anesthesia Evaluation  Patient identified by MRN, date of birth, ID band Patient awake    Reviewed: Allergy & Precautions, NPO status , Patient's Chart, lab work & pertinent test results  Airway Mallampati: II  TM Distance: >3 FB Neck ROM: Full    Dental  (+) Teeth Intact, Dental Advisory Given   Pulmonary    breath sounds clear to auscultation       Cardiovascular  Rhythm:Regular Rate:Normal     Neuro/Psych    GI/Hepatic   Endo/Other    Renal/GU      Musculoskeletal   Abdominal (+) + obese,   Peds  Hematology   Anesthesia Other Findings   Reproductive/Obstetrics                             Anesthesia Physical Anesthesia Plan  ASA: IV and emergent  Anesthesia Plan: General   Post-op Pain Management:    Induction: Intravenous  PONV Risk Score and Plan: Ondansetron and Dexamethasone  Airway Management Planned: Oral ETT  Additional Equipment: Arterial line, PA Cath, 3D TEE and Ultrasound Guidance Line Placement  Intra-op Plan:   Post-operative Plan: Post-operative intubation/ventilation  Informed Consent: I have reviewed the patients History and Physical, chart, labs and discussed the procedure including the risks, benefits and alternatives for the proposed anesthesia with the patient or authorized representative who has indicated his/her understanding and acceptance.     Dental advisory given  Plan Discussed with: CRNA and Anesthesiologist  Anesthesia Plan Comments:         Anesthesia Quick Evaluation

## 2019-09-19 NOTE — Anesthesia Procedure Notes (Signed)
Procedure Name: Intubation Date/Time: 09/19/2019 10:01 AM Performed by: Marena Chancy, CRNA Pre-anesthesia Checklist: Patient identified, Emergency Drugs available, Suction available and Patient being monitored Patient Re-evaluated:Patient Re-evaluated prior to induction Oxygen Delivery Method: Circle System Utilized Preoxygenation: Pre-oxygenation with 100% oxygen Induction Type: IV induction Ventilation: Mask ventilation without difficulty Laryngoscope Size: Miller and 2 Grade View: Grade I Tube type: Oral Tube size: 8.0 mm Number of attempts: 1 Airway Equipment and Method: Stylet and Oral airway Placement Confirmation: ETT inserted through vocal cords under direct vision,  positive ETCO2 and breath sounds checked- equal and bilateral Tube secured with: Tape Dental Injury: Teeth and Oropharynx as per pre-operative assessment

## 2019-09-19 NOTE — Brief Op Note (Signed)
      [address]       [name] [address]             [phone]      09/19/2019  4:12 PM  PATIENT:  Dola Argyle Bowler  59 y.o. male  PRE-OPERATIVE DIAGNOSIS:  STEMI lad- diag disease , IAB placed preop   POST-OPERATIVE DIAGNOSIS same   PROCEDURE:  Procedure(s): CORONARY ARTERY BYPASS GRAFTING (CABG) using LIMA to LAD; Endoscopic Right Greater Saphenous Vein Harvest: SVG to Diag. (N/A) TRANSESOPHAGEAL ECHOCARDIOGRAM (TEE) (N/A) Endovein Harvest Of Greater Saphenous Vein (Right)  Emergency direct from cath lab   SURGEON:  Surgeon(s) and Role:    * Delight Ovens, MD - Primary  PHYSICIAN ASSISTANT:   Jari Favre, PA-C   ANESTHESIA:   general  EBL:  400 mL  No blood products given   BLOOD ADMINISTERED:none  DRAINS: ROUTINE   LOCAL MEDICATIONS USED:  None   SPECIMEN:  No Specimen  DISPOSITION OF SPECIMEN:  PATHOLOGY  COUNTS:  YES  DICTATION: .Dragon Dictation  PLAN OF CARE: Admit to inpatient   PATIENT DISPOSITION:  ICU - intubated and hemodynamically stable.   Delay start of Pharmacological VTE agent (>24hrs) due to surgical blood loss or risk of bleeding: yes

## 2019-09-19 NOTE — Anesthesia Procedure Notes (Signed)
Arterial Line Insertion Start/End5/25/2021 10:00 AM, 09/19/2019 10:10 AM Performed by: Adair Laundry, CRNA, CRNA  Preanesthetic checklist: patient identified, IV checked, site marked, risks and benefits discussed, surgical consent, monitors and equipment checked, pre-op evaluation, timeout performed and anesthesia consent Lidocaine 1% used for infiltration Left, radial was placed Catheter size: 20 G Hand hygiene performed  and maximum sterile barriers used   Attempts: 3 Procedure performed using ultrasound guided technique. Following insertion, Biopatch and dressing applied. Post procedure assessment: normal  Patient tolerated the procedure well with no immediate complications.

## 2019-09-19 NOTE — H&P (Addendum)
Cardiology Admission History and Physical:   Patient ID: [name] MRN: [id number]; DOB: 1960-07-14   Admission date: 09/19/2019  Primary Care Provider: No primary care provider on file. Primary Cardiologist: No primary care provider on file. [name], III Primary Electrophysiologist:  None   Chief Complaint:  Anterior STEMI  Patient Profile:   [name] is a 59 y.o. male with with no chronic medical conditions who is a non-smoker and presents with an anterior ST elevation myocardial infarction.  History of Present Illness:   [name] severe starting at 5:30 AM.  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  This morning the chest discomfort was unremitting and he came to the emergency room where EKG demonstrated ischemic anterior wall changes with reciprocal inferior abnormalities.  Upon arrival in the Cath Lab he was totally pain-free.  He does not smoke, is nondiabetic, denies hypertension, and does not know his cholesterol status.  He does not have a primary care physician.  His father has coronary artery disease and is greater than 59 years of age.  He is a patient for whom I have taken care of for many years.  His brother who is 3 years younger has also had a myocardial infarction.   History reviewed. No pertinent past medical history.  History reviewed. No pertinent surgical history.   Medications Prior to Admission: Prior to Admission medications   Not on File     Allergies:   No Known Allergies  Social History:   Social History   Socioeconomic History  . Marital status: Married    Spouse name: Not on file  . Number of children: Not on file  . Years of education: Not on file  . Highest education level: Not on file  Occupational History  . Not on file  Tobacco Use  . Smoking status: Not on file  Substance and Sexual Activity  . Alcohol use: Not on file  . Drug use: Not on file  . Sexual activity: Not on  file  Other Topics Concern  . Not on file  Social History Narrative  . Not on file   Social Determinants of Health   Financial Resource Strain:   . Difficulty of Paying Living Expenses:   Food Insecurity:   . Worried About Charity fundraiser in the Last Year:   . Arboriculturist in the Last Year:   Transportation Needs:   . Film/video editor (Medical):   Marland Kitchen Lack of Transportation (Non-Medical):   Physical Activity:   . Days of Exercise per Week:   . Minutes of Exercise per Session:   Stress:   . Feeling of Stress :   Social Connections:   . Frequency of Communication with Friends and Family:   . Frequency of Social Gatherings with Friends and Family:   . Attends Religious Services:   . Active Member of Clubs or Organizations:   . Attends Archivist Meetings:   Marland Kitchen Marital Status:   Intimate Partner Violence:   . Fear of Current or Ex-Partner:   . Emotionally Abused:   Marland Kitchen Physically Abused:   . Sexually Abused:     Family History: CAD positive in father and brother The patient's family history is not on file.    ROS:  Please see the history of present illness.  Obese, snores, but no chronic medical conditions or complaints.  All other ROS reviewed and negative.  Physical Exam/Data:   Vitals:   09/19/19 0831 09/19/19 0836 09/19/19 0840 09/19/19 0845  BP: (!) 149/83 134/69 (!) 141/63 (!) 153/71  Pulse: 83 81 82 97  Resp: 15 14 17 13   Temp:      TempSrc:      SpO2: (!) 0% (!) 0% 100% 95%  Weight:      Height:       No intake or output data in the 24 hours ending 09/19/19 0906 Last 3 Weights 09/19/2019  Weight (lbs) 228 lb  Weight (kg) 103.42 kg     Body mass index is 31.8 kg/m.  General:  Well nourished, well developed, in no acute distress.  Has abdominal obesity. HEENT: normal Lymph: no adenopathy Neck: Unable to visualize neck veins because of full beard.  Endocrine:  No thryomegaly Vascular: No carotid bruits; FA pulses 2+ bilaterally  without bruits  Cardiac:  normal S1, S2; RRR; no murmur  Lungs:  clear to auscultation bilaterally, no wheezing, rhonchi or rales  Abd: soft, nontender, no hepatomegaly  Ext: no edema Musculoskeletal:  No deformities, BUE and BLE strength normal and equal Skin: warm and dry  Neuro:  CNs 2-12 intact, no focal abnormalities noted Psych:  Normal affect    EKG:  The ECG that was done at 6:26 AM was personally reviewed and demonstrates ST elevation V1 and V2 with reciprocal ST segment depression to, 3, and aVF.  Relevant CV Studies: No prior studies  Laboratory Data:  High Sensitivity Troponin:   Recent Labs  Lab 09/19/19 0643  TROPONINIHS 41*      Chemistry Recent Labs  Lab 09/19/19 0643  NA 140  K 3.5  CL 105  CO2 21*  GLUCOSE 154*  BUN 16  CREATININE 1.00  CALCIUM 9.1  GFRNONAA >60  GFRAA >60  ANIONGAP 14    No results for input(s): PROT, ALBUMIN, AST, ALT, ALKPHOS, BILITOT in the last 168 hours. Hematology Recent Labs  Lab 09/19/19 0643  WBC 8.1  RBC 5.32  HGB 16.1  HCT 48.9  MCV 91.9  MCH 30.3  MCHC 32.9  RDW 12.3  PLT 301   BNPNo results for input(s): BNP, PROBNP in the last 168 hours.  DDimer No results for input(s): DDIMER in the last 168 hours.   Radiology/Studies:  CARDIAC CATHETERIZATION  Result Date: 09/19/2019  Anterior ST elevation myocardial infarction  99% trifurcating proximal LAD, diagonal, septal perforator with TIMI grade I-II flow initially improving to TIMI grade III after a wire in the anterior descending, IV heparin, and intracoronary nitroglycerin.  Became totally pain-free.  Left main is widely patent  Circumflex is widely patent  RCA with eccentric distal 40 to 50% narrowing  Mid anterior wall akinesis.  EF 45%.  LVEDP 16.  Insertion of intra-aortic balloon pump RECOMMENDATIONS:  After consideration, revascularization with urgent coronary surgery due to anatomic subset that is not favorable for PCI is recommended..   Intra-aortic balloon pump to improve stability  IV heparin keeping ACT greater than 200.  DG Chest Port 1 View  Result Date: 09/19/2019 CLINICAL DATA:  Chest pressure, worse this morning EXAM: PORTABLE CHEST 1 VIEW COMPARISON:  None FINDINGS: Mild nonspecific elevation of the right hemidiaphragm. Slight streaky basilar opacities likely reflecting atelectasis. No consolidation, features of edema, pneumothorax, or effusion. The cardiomediastinal contours are unremarkable. No acute osseous or soft tissue abnormality. Telemetry leads and pacer pads overlie the chest. IMPRESSION: Minimal basilar atelectasis. No other acute cardiopulmonary abnormality. Electronically Signed   By: 09/21/2019  [facility] M.D.   On: 09/19/2019 06:57        TIMI Risk Score for ST  Elevation MI:   The patient's TIMI risk score is 2, which indicates a 2.2% risk of all cause mortality at 30 days.    Assessment and Plan:   1. Anterior ST elevation myocardial infarction with wire reperfusion and improvement in anterior wall myocardial blood flow from TIMI grade I to TIMI grade III.  Anatomic substrate is unfavorable for PCI due to trifurcation of 3 large branches.  Discussed with cardiac surgery, Dr.Owen.  Decision made to proceed with emergency coronary revascularization with LIMA to LAD and vein graft to the large diagonal. 2. Acute systolic heart failure with EF 45%.  LVEDP 16 mmHg.  Mid anterior wall severe hypokinesis is noted suggesting predominant diagonal territory injury. 3. Elevated blood pressure 4. Elevated blood sugar 5. Lipid status unknown     Severity of Illness: The appropriate patient status for this patient is INPATIENT. Inpatient status is judged to be reasonable and necessary in order to provide the required intensity of service to ensure the patient's safety. The patient's presenting symptoms, physical exam findings, and initial radiographic and laboratory data in the context of their chronic comorbidities is  felt to place them at high risk for further clinical deterioration. Furthermore, it is not anticipated that the patient will be medically stable for discharge from the hospital within 2 midnights of admission. The following factors support the patient status of inpatient.   " The patient's presenting symptoms include chest pain. " The worrisome physical exam findings include none. " The initial radiographic and laboratory data are worrisome because of elevated cardiac markers. " The chronic co-morbidities include obesity/family history of CAD/likely untreated diabetes..   * I certify that at the point of admission it is my clinical judgment that the patient will require inpatient hospital care spanning beyond 2 midnights from the point of admission due to high intensity of service, high risk for further deterioration and high frequency of surveillance required.*    For questions or updates, please contact [facility] Please consult [email or web address] for contact info under    CRITICAL CARE TIME: 45 minutes    Signed, Lesleigh Noe, MD  09/19/2019 9:06 AM

## 2019-09-19 NOTE — Plan of Care (Signed)

## 2019-09-19 NOTE — Transfer of Care (Signed)
Immediate Anesthesia Transfer of Care Note  Patient: [name]  Procedure(s) Performed: CORONARY ARTERY BYPASS GRAFTING (CABG) using LIMA to LAD; Endoscopic Right Greater Saphenous Vein Harvest: SVG to Diag. (N/A Chest) TRANSESOPHAGEAL ECHOCARDIOGRAM (TEE) (N/A ) Endovein Harvest Of Greater Saphenous Vein (Right Leg Upper)  Patient Location: ICU  Anesthesia Type:General  Level of Consciousness: sedated and unresponsive  Airway & Oxygen Therapy: Patient remains intubated per anesthesia plan and Patient placed on Ventilator (see vital sign flow sheet for setting)  Post-op Assessment: Report given to RN and Post -op Vital signs reviewed and stable  Post vital signs: Reviewed and stable  Last Vitals:  Vitals Value Taken Time  BP    Temp    Pulse    Resp    SpO2      Last Pain:  Vitals:   09/19/19 0927  TempSrc:   PainSc: 0-No pain         Complications: No apparent anesthesia complications

## 2019-09-19 NOTE — Consult Note (Signed)
TCTS BRIEF CONSULTATION NOTE  Called to the Cath Lab for emergency cardiothoracic surgical consultation.  Patient is a 59 year old male with no previous history of coronary artery disease who describes a several month week history of symptoms of "indigestion" and develop sudden onset severe substernal chest pressure associated with shortness of breath at rest this morning.  Symptoms persisted ultimately prompting him to present to the emergency department where baseline EKG revealed sinus rhythm with ST segment elevation in leads V1 through V3.  Patient was taken directly to the cardiac Cath Lab by Dr. Katrinka Blazing.  Chest pain had resolved prior to commencement of diagnostic catheterization.  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  Initially there was TIMI II flow with sluggish flow in the large diagonal branch.  Following heparinization and passing guidewire down the distal left anterior descending coronary artery flow appeared to improve and the patient remained pain-free.  There was insignificant disease in the left circumflex and right coronary artery territories.  Ejection fraction was estimated 40% with anterior wall akinesis.  Left ventricular end-diastolic pressure is 16 mmHg.  I agree that patient would likely best be treated with surgical revascularization as coronary anatomy is appears unfavorable for definitive PCI and stenting.  I have briefly counseled the patient while he remains on the Cath Lab table regarding the indications, risk, and potential benefits of emergency coronary artery bypass grafting.  I favor placement of intra-aortic balloon pump to facilitate safe transition to the operating room for emergency surgical revascularization.   I spent in excess of 30 minutes during the conduct of this hospital consultation and >50% of this  time involved direct face-to-face encounter for counseling and/or coordination of the patient's care.   Salvatore Decent. Cornelius Moras, MD 09/19/2019 8:04 AM

## 2019-09-19 NOTE — CV Procedure (Signed)
   Onset of chest discomfort at 5: 30 AM, 8 out of 10 in severity and upon reporting to the emergency room had V1 through V3 ST elevation with reciprocal depression and low-grade chest discomfort.  Upon arrival in the Cath Lab the patient was pain-free.  Right radial approach using real-time vascular ultrasound for guidance  Left main is widely patent.    LAD contains a segmental 95 to 99% stenosis and there is a trifurcation with large first septal perforator, LAD, and large aneurysmal diagonal which supplies anterolateral wall. There is TIMI grade I-II flow initially.  Circumflex is widely patent  RCA has distal eccentric 40 to 50% narrowing.  RCA is dominant.  Mid anterior wall akinesis with EF 40%.  LVEDP is 16 mmHg.  10,000 units of IV heparin was administered and a Prowater guidewire across the LAD led to TIMI grade III flow.  The large diagonal is 99% obstructed and also has developed TIMI grade III flow with wire crossing into the LAD.  Patient remains asymptomatic the anatomic substrate is unfavorable for PCI.  100 mcg of intracoronary nitroglycerin was administered.  5 mg of IV metoprolol was administered.  Dr.:Owen and I discussed the case.  I felt this surgical treatment with a LIMA to LAD and saphenous vein graft to the diagonal would be the best long-term approach for this patient given the amount of myocardium at risk and given his stable condition currently.  Per conversation with Dr.Owen, intra-aortic balloon pump was placed in the right femoral using real-time vascular ultrasound guidance.  A single anterior wall stick was performed under direct visualization.

## 2019-09-19 NOTE — Anesthesia Procedure Notes (Signed)
Central Venous Catheter Insertion Performed by: Kipp Brood, MD, anesthesiologist Start/End5/25/2021 9:50 AM, 09/19/2019 10:00 AM Patient location: Pre-op. Preanesthetic checklist: patient identified, IV checked, site marked, risks and benefits discussed, surgical consent, monitors and equipment checked, pre-op evaluation, timeout performed and anesthesia consent Position: supine Hand hygiene performed  and maximum sterile barriers used  PA cath was placed.Swan type:thermodilution Procedure performed without using ultrasound guided technique. Attempts: 1 Following insertion, line sutured. Post procedure assessment: no air, free fluid flow and blood return through all ports  Patient tolerated the procedure well with no immediate complications.

## 2019-09-19 NOTE — Progress Notes (Signed)
Patient ID: [name], male   DOB: 10-04-1960, 59 y.o.   MRN: [id number]      [name]       [name],[place] [address]             [phone]        Lyon R Rech [place] Medical Record #[id number] Date of Birth: 11-26-1960  Referring: No ref. provider found Primary Care: No primary care provider on file. Primary Cardiologist:No primary care provider on file.  Chief Complaint:    Chief Complaint  Patient presents with  . Chest Pain    History of Present Illness:     Patient is non diabetic , non smoker who has had increasing indigestion ocer past weeks, This am when got up to go towrk had onset of chest pain . Positive family history of CAD , I did CABG on Dad now is 73   Cath Lab:  Onset of chest discomfort at 5: 30 AM, 8 out of 10 in severity and upon reporting to the emergency room had V1 through V3 ST elevation with reciprocal depression and low-grade chest discomfort.  Upon arrival in the Cath Lab the patient was pain-free.  Right radial approach using real-time vascular ultrasound for guidance  Left main is widely patent.    LAD contains a segmental 95 to 99% stenosis and there is a trifurcation with large first septal perforator, LAD, and large aneurysmal diagonal which supplies anterolateral wall. There is TIMI grade I-II flow initially.  Circumflex is widely patent  RCA has distal eccentric 40 to 50% narrowing.  RCA is dominant.  Mid anterior wall akinesis with EF 40%.  LVEDP is 16 mmHg.  10,000 units of IV heparin was administered and a Prowater guidewire across the LAD led to TIMI grade III flow.  The large diagonal is 99% obstructed and also has developed TIMI grade III flow with wire crossing into the LAD.  Patient remains asymptomatic the anatomic substrate is unfavorable for PCI.  100 mcg of intracoronary nitroglycerin was administered.  5 mg of IV metoprolol was administered.  Dr.:Owen and I discussed the case.  I felt this surgical  treatment with a LIMA to LAD and saphenous vein graft to the diagonal would be the best long-term approach for this patient given the amount of myocardium at risk and given his stable condition currently.  Per conversation with Dr.Owen, intra-aortic balloon pump was placed in the right femoral using real-time vascular ultrasound guidance.  A single anterior wall stick was performed under direct visualization.   Current Activity/ Functional Status: Patient is independent with mobility/ambulation, transfers, ADL's, IADL's.   Zubrod Score: At the time of surgery this patient's most appropriate activity status/level should be described as: [x]     0    Normal activity, no symptoms []     1    Restricted in physical strenuous activity but ambulatory, able to do out light work []     2    Ambulatory and capable of self care, unable to do work activities, up and about                 more than 50%  Of the time                            []     3    Only limited self care, in bed greater than 50% of waking hours []   4    Completely disabled, no self care, confined to bed or chair []     5    Moribund   as above  No previous surgery   Social History   Tobacco Use  Smoking Status Not on file    Social History   Substance and Sexual Activity  Alcohol Use None     No Known Allergies  Current Facility-Administered Medications  Medication Dose Route Frequency Provider Last Rate Last Admin  . 0.9 %  sodium chloride infusion   Intravenous Continuous , Wilkie Aye, MD 20 mL/hr at 09/19/19 0641 20 mL/hr at 09/19/19 0641  . cefUROXime (ZINACEF) 1.5 g in sodium chloride 0.9 % 100 mL IVPB  1.5 g Intravenous To OR 09/21/19, MD      . cefUROXime (ZINACEF) 750 mg in sodium chloride 0.9 % 100 mL IVPB  750 mg Intravenous To OR Purcell Nails, MD      . dexmedetomidine (PRECEDEX) 400 MCG/100ML (4 mcg/mL) infusion  0.1-0.7 mcg/kg/hr Intravenous To OR Purcell Nails, MD      . EPINEPHrine  (ADRENALIN) 4 mg in NS 250 mL (0.016 mg/mL) premix infusion  0-10 mcg/min Intravenous To OR Purcell Nails, MD      . heparin 30,000 units/NS 1000 mL solution for CELLSAVER   Other To OR Purcell Nails, MD      . heparin sodium (porcine) 2,500 Units, papaverine 30 mg in electrolyte-148 (PLASMALYTE-148) 500 mL irrigation   Irrigation To OR Purcell Nails, MD      . insulin regular, human (MYXREDLIN) 100 units/ 100 mL infusion   Intravenous To OR Purcell Nails, MD      . magnesium sulfate (IV Push/IM) injection 40 mEq  40 mEq Other To OR Purcell Nails, MD      . milrinone (PRIMACOR) 20 MG/100 ML (0.2 mg/mL) infusion  0.3 mcg/kg/min Intravenous To OR Purcell Nails, MD      . Purcell Nails Hold] nitroGLYCERIN (NITROSTAT) SL tablet 0.4 mg  0.4 mg Sublingual Q5 Min x 3 PRN Mitzi Hansen, MD   0.4 mg at 09/19/19 0650  . nitroGLYCERIN 50 mg in dextrose 5 % 250 mL (0.2 mg/mL) infusion  2-200 mcg/min Intravenous To OR 09/21/19, MD      . norepinephrine (LEVOPHED) 4mg  in Purcell Nails premix infusion  0-40 mcg/min Intravenous To OR , MD      . phenylephrine (NEOSYNEPHRINE) 20-0.9 MG/250ML-% infusion  30-200 mcg/min Intravenous To OR , MD      . potassium chloride injection 80 mEq  80 mEq Other To OR Purcell Nails, MD      . Purcell Nails Hold] sodium chloride flush (NS) 0.9 % injection 3 mL  3 mL Intravenous Once Horton, Purcell Nails, MD      . tranexamic acid (CYKLOKAPRON) 2,500 mg in sodium chloride 0.9 % 250 mL (10 mg/mL) infusion  1.5 mg/kg/hr Intravenous To OR Mitzi Hansen, MD      . tranexamic acid (CYKLOKAPRON) bolus via infusion - over 30 minutes 1,551 mg  15 mg/kg Intravenous To OR Mayer Masker, MD      . tranexamic acid (CYKLOKAPRON) pump prime solution 207 mg  2 mg/kg Intracatheter To OR Purcell Nails, MD      . vancomycin (VANCOCIN) 1,000 mg in sodium chloride 0.9 % 1,000 mL irrigation   Irrigation To OR Purcell Nails, MD      . vancomycin  (  VANCOREADY) IVPB 1500 mg/300 mL  1,500 mg Intravenous To OR Purcell Nails, MD        No medications prior to admission.      Review of Systems:  Pertinent items are noted in HPI.     Cardiac Review of Systems: Y or  [    ]= no  Chest Pain [  y  ]  Resting SOB [ n] Exertional SOB  [ n ]  Orthopnea [n  ]   Pedal Edema [ n  ]    Palpitations [ n ] Syncope  [ n ]   Presyncope [ n  ]  General Review of Systems: [Y] = yes [  ]=no Constitional: recent weight change [  ]; anorexia [  ]; fatigue [  ]; nausea [  ]; night sweats [  ]; fever [  ]; or chills [  ]                                                               Dental: Last Dentist visit:   Eye : blurred vision [  ]; diplopia [   ]; vision changes [  ];  Amaurosis fugax[  ]; Resp: cough [  ];  wheezing[  ];  hemoptysis[  ]; shortness of breath[  ]; paroxysmal nocturnal dyspnea[  ]; dyspnea on exertion[  ]; or orthopnea[  ];  GI:  gallstones[  ], vomiting[  ];  dysphagia[  ]; melena[  ];  hematochezia [  ]; heartburn[  ];   Hx of  Colonoscopy[  ]; GU: kidney stones [  ]; hematuria[  ];   dysuria [  ];  nocturia[  ];  history of     obstruction [  ]; urinary frequency [  ]             Skin: rash, swelling[  ];, hair loss[  ];  peripheral edema[  ];  or itching[  ]; Musculosketetal: myalgias[  ];  joint swelling[  ];  joint erythema[  ];  joint pain[  ];  back pain[  ];  Heme/Lymph: bruising[  ];  bleeding[  ];  anemia[  ];  Neuro: TIA[  ];  headaches[  ];  stroke[  ];  vertigo[  ];  seizures[  ];   paresthesias[  ];  difficulty walking[  ];  Psych:depression[  ]; anxiety[  ];  Endocrine: diabetes[ n ];  thyroid dysfunction[  ];               Physical Exam: BP (!) 153/71   Pulse 97   Temp 98.6 F (37 C) (Oral)   Resp 13   Ht 5\' 11"  (1.803 m)   Wt 103.4 kg   SpO2 95%   BMI 31.80 kg/m    General appearance: alert, cooperative and mild distress Head: Normocephalic, without obvious abnormality, atraumatic Neck: no  adenopathy, no carotid bruit, no JVD, supple, symmetrical, trachea midline and thyroid not enlarged, symmetric, no tenderness/mass/nodules Resp: clear to auscultation bilaterally Cardio: regular rate and rhythm, S1, S2 normal, no murmur, click, rub or gallop GI: soft, non-tender; bowel sounds normal; no masses,  no organomegaly Extremities: extremities normal, atraumatic, no cyanosis or edema and Homans sign is negative, no sign of DVT Neurologic: Grossly  normal IAB right Groin- palpable dt and PT   Diagnostic Studies & Laboratory data:     Recent Radiology Findings:   CARDIAC CATHETERIZATION  Result Date: 09/19/2019  Anterior ST elevation myocardial infarction  99% trifurcating proximal LAD, diagonal, septal perforator with TIMI grade I-II flow initially improving to TIMI grade III after a wire in the anterior descending, IV heparin, and intracoronary nitroglycerin.  Became totally pain-free.  Left main is widely patent  Circumflex is widely patent  RCA with eccentric distal 40 to 50% narrowing  Mid anterior wall akinesis.  EF 45%.  LVEDP 16.  Insertion of intra-aortic balloon pump RECOMMENDATIONS:  After consideration, revascularization with urgent coronary surgery due to anatomic subset that is not favorable for PCI is recommended..  Intra-aortic balloon pump to improve stability  IV heparin keeping ACT greater than 200.  DG Chest Port 1 View  Result Date: 09/19/2019 CLINICAL DATA:  Chest pressure, worse this morning EXAM: PORTABLE CHEST 1 VIEW COMPARISON:  None FINDINGS: Mild nonspecific elevation of the right hemidiaphragm. Slight streaky basilar opacities likely reflecting atelectasis. No consolidation, features of edema, pneumothorax, or effusion. The cardiomediastinal contours are unremarkable. No acute osseous or soft tissue abnormality. Telemetry leads and pacer pads overlie the chest. IMPRESSION: Minimal basilar atelectasis. No other acute cardiopulmonary abnormality.  Electronically Signed   By: Kreg Shropshire M.D.   On: 09/19/2019 06:57     I have independently reviewed the above radiologic studies and discussed with the patient   Recent Lab Findings: Lab Results  Component Value Date   WBC 8.1 09/19/2019   HGB 16.1 09/19/2019   HCT 48.9 09/19/2019   PLT 301 09/19/2019   GLUCOSE 154 (H) 09/19/2019   CHOL 203 (H) 09/19/2019   TRIG 146 09/19/2019   HDL 43 09/19/2019   LDLCALC 131 (H) 09/19/2019   NA 140 09/19/2019   K 3.5 09/19/2019   CL 105 09/19/2019   CREATININE 1.00 09/19/2019   BUN 16 09/19/2019   CO2 21 (L) 09/19/2019   INR 1.0 09/19/2019   HGBA1C 6.3 (H) 09/19/2019      Assessment / Plan:   Patient seen by Dr Cornelius Moras for emergency CABG After above presentation  he is now not available to start -  - I have seen patient and dicussed proceeding urgently with CABG . Also have discussed with his wife. The goals risks and alternatives of the planned surgical procedure Procedure(s): CORONARY ARTERY BYPASS GRAFTING (CABG) (N/A) TRANSESOPHAGEAL ECHOCARDIOGRAM (TEE) (N/A)  have been discussed with the patient in detail. The risks of the procedure including death, infection, stroke, myocardial infarction, bleeding, blood transfusion have all been discussed specifically.  I have quoted Dola Argyle Gomez a 5 % of perioperative mortality and a complication rate as high as 50 %. The patient's and his wifes  questions have been answered.[name] is willing  to proceed with the planned procedure.     Delight Ovens MD      [address] [place] [place] [address] Office [phone]     09/19/2019 9:57 AM

## 2019-09-19 NOTE — Procedures (Signed)
Extubation Procedure Note  Patient Details:   Name: [name] DOB: 03-13-1961 MRN: [id number]   Airway Documentation:    Vent end date: 09/19/19 Vent end time: 1851   Evaluation  O2 sats: stable throughout Complications: No apparent complications Patient did tolerate procedure well. Bilateral Breath Sounds: Clear, Diminished   Pt extubated to 4L  per rapid wean protocol. NIF was -30 and VC was . Pt had positive cuff leak and no stridor was noted. Pt able to voice his name.   Guss Bunde 09/19/2019, 6:52 PM

## 2019-09-19 NOTE — ED Notes (Signed)
Activated code Stemi--Leslie

## 2019-09-19 NOTE — Progress Notes (Signed)
TCTS BRIEF SICU PROGRESS NOTE  Day of Surgery  S/P Procedure(s) (LRB): CORONARY ARTERY BYPASS GRAFTING (CABG) using LIMA to LAD; Endoscopic Right Greater Saphenous Vein Harvest: SVG to Diag. (N/A) TRANSESOPHAGEAL ECHOCARDIOGRAM (TEE) (N/A) Endovein Harvest Of Greater Saphenous Vein (Right)   Waking up on vent NSR - AAI paced Stable hemodynamics w/ IABP 1:1 Chest tube output low UOP > 150 mL/hr Labs okay  Plan: Continue routine early postop  Purcell Nails, MD 09/19/2019 6:48 PM

## 2019-09-19 NOTE — ED Triage Notes (Signed)
To ED for eval of chest pressure - started yesterday. Slept well. Pain worse this am when taking a shower. Pain again worse walking into ED. Relief when sitting down. No nausea or vomiting.

## 2019-09-19 NOTE — Anesthesia Postprocedure Evaluation (Signed)
Anesthesia Post Note  Patient: Daimien Patmon Mcandrew  Procedure(s) Performed: CORONARY ARTERY BYPASS GRAFTING (CABG) using LIMA to LAD; Endoscopic Right Greater Saphenous Vein Harvest: SVG to Diag. (N/A Chest) TRANSESOPHAGEAL ECHOCARDIOGRAM (TEE) (N/A ) Endovein Harvest Of Greater Saphenous Vein (Right Leg Upper)     Patient location during evaluation: SICU Anesthesia Type: General Level of consciousness: sedated and patient remains intubated per anesthesia plan Pain management: pain level controlled Vital Signs Assessment: post-procedure vital signs reviewed and stable Respiratory status: patient remains intubated per anesthesia plan and patient on ventilator - see flowsheet for VS Cardiovascular status: stable Postop Assessment: no apparent nausea or vomiting Anesthetic complications: no    Last Vitals:  Vitals:   09/19/19 1900 09/19/19 1901  BP: 115/81   Pulse: 91 89  Resp: 20 20  Temp: 37 C 37 C  SpO2: 97% 96%    Last Pain:  Vitals:   09/19/19 0927  TempSrc:   PainSc: 0-No pain                 Kenith Trickel COKER

## 2019-09-20 ENCOUNTER — Inpatient Hospital Stay (HOSPITAL_COMMUNITY): Payer: 59

## 2019-09-20 DIAGNOSIS — Z951 Presence of aortocoronary bypass graft: Secondary | ICD-10-CM

## 2019-09-20 DIAGNOSIS — E7800 Pure hypercholesterolemia, unspecified: Secondary | ICD-10-CM

## 2019-09-20 DIAGNOSIS — R739 Hyperglycemia, unspecified: Secondary | ICD-10-CM

## 2019-09-20 LAB — CBC
HCT: 32.8 % — ABNORMAL LOW (ref 39.0–52.0)
HCT: 33.9 % — ABNORMAL LOW (ref 39.0–52.0)
Hemoglobin: 10.9 g/dL — ABNORMAL LOW (ref 13.0–17.0)
Hemoglobin: 11.3 g/dL — ABNORMAL LOW (ref 13.0–17.0)
MCH: 30.8 pg (ref 26.0–34.0)
MCH: 31 pg (ref 26.0–34.0)
MCHC: 33.2 g/dL (ref 30.0–36.0)
MCHC: 33.3 g/dL (ref 30.0–36.0)
MCV: 92.7 fL (ref 80.0–100.0)
MCV: 93.1 fL (ref 80.0–100.0)
Platelets: 130 10*3/uL — ABNORMAL LOW (ref 150–400)
Platelets: 133 10*3/uL — ABNORMAL LOW (ref 150–400)
RBC: 3.54 MIL/uL — ABNORMAL LOW (ref 4.22–5.81)
RBC: 3.64 MIL/uL — ABNORMAL LOW (ref 4.22–5.81)
RDW: 12.6 % (ref 11.5–15.5)
RDW: 13 % (ref 11.5–15.5)
WBC: 12.3 10*3/uL — ABNORMAL HIGH (ref 4.0–10.5)
WBC: 17.4 10*3/uL — ABNORMAL HIGH (ref 4.0–10.5)
nRBC: 0 % (ref 0.0–0.2)
nRBC: 0 % (ref 0.0–0.2)

## 2019-09-20 LAB — POCT ACTIVATED CLOTTING TIME
Activated Clotting Time: 246 seconds
Activated Clotting Time: 257 seconds
Activated Clotting Time: 307 seconds
Activated Clotting Time: 246 seconds
Activated Clotting Time: 120 seconds

## 2019-09-20 LAB — BASIC METABOLIC PANEL
Anion gap: 7 (ref 5–15)
Anion gap: 7 (ref 5–15)
BUN: 13 mg/dL (ref 6–20)
BUN: 17 mg/dL (ref 6–20)
CO2: 21 mmol/L — ABNORMAL LOW (ref 22–32)
CO2: 21 mmol/L — ABNORMAL LOW (ref 22–32)
Calcium: 7.2 mg/dL — ABNORMAL LOW (ref 8.9–10.3)
Calcium: 7.4 mg/dL — ABNORMAL LOW (ref 8.9–10.3)
Chloride: 111 mmol/L (ref 98–111)
Chloride: 108 mmol/L (ref 98–111)
Creatinine, Ser: 1.02 mg/dL (ref 0.61–1.24)
Creatinine, Ser: 1.16 mg/dL (ref 0.61–1.24)
GFR calc Af Amer: >60 mL/min (ref >60)
GFR calc Af Amer: >60 mL/min (ref >60)
GFR calc non Af Amer: >60 mL/min (ref >60)
GFR calc non Af Amer: >60 mL/min (ref >60)
Glucose, Bld: 155 mg/dL — ABNORMAL HIGH (ref 70–99)
Glucose, Bld: 167 mg/dL — ABNORMAL HIGH (ref 70–99)
Potassium: 4.7 mmol/L (ref 3.5–5.1)
Potassium: 4.6 mmol/L (ref 3.5–5.1)
Sodium: 139 mmol/L (ref 135–145)
Sodium: 136 mmol/L (ref 135–145)

## 2019-09-20 LAB — POCT I-STAT, CHEM 8
BUN: 16 mg/dL (ref 6–20)
BUN: 13 mg/dL (ref 6–20)
BUN: 12 mg/dL (ref 6–20)
BUN: 13 mg/dL (ref 6–20)
BUN: 12 mg/dL (ref 6–20)
BUN: 12 mg/dL (ref 6–20)
Calcium, Ion: 1.19 mmol/L (ref 1.15–1.40)
Calcium, Ion: 1.18 mmol/L (ref 1.15–1.40)
Calcium, Ion: 0.93 mmol/L — ABNORMAL LOW (ref 1.15–1.40)
Calcium, Ion: 1.16 mmol/L (ref 1.15–1.40)
Calcium, Ion: 1 mmol/L — ABNORMAL LOW (ref 1.15–1.40)
Calcium, Ion: 1.02 mmol/L — ABNORMAL LOW (ref 1.15–1.40)
Chloride: 106 mmol/L (ref 98–111)
Chloride: 106 mmol/L (ref 98–111)
Chloride: 103 mmol/L (ref 98–111)
Chloride: 107 mmol/L (ref 98–111)
Chloride: 105 mmol/L (ref 98–111)
Chloride: 105 mmol/L (ref 98–111)
Creatinine, Ser: 0.9 mg/dL (ref 0.61–1.24)
Creatinine, Ser: 0.6 mg/dL — ABNORMAL LOW (ref 0.61–1.24)
Creatinine, Ser: 0.7 mg/dL (ref 0.61–1.24)
Creatinine, Ser: 0.8 mg/dL (ref 0.61–1.24)
Creatinine, Ser: 0.7 mg/dL (ref 0.61–1.24)
Creatinine, Ser: 0.7 mg/dL (ref 0.61–1.24)
Glucose, Bld: 170 mg/dL — ABNORMAL HIGH (ref 70–99)
Glucose, Bld: 125 mg/dL — ABNORMAL HIGH (ref 70–99)
Glucose, Bld: 115 mg/dL — ABNORMAL HIGH (ref 70–99)
Glucose, Bld: 97 mg/dL (ref 70–99)
Glucose, Bld: 125 mg/dL — ABNORMAL HIGH (ref 70–99)
Glucose, Bld: 170 mg/dL — ABNORMAL HIGH (ref 70–99)
HCT: 41 % (ref 39.0–52.0)
HCT: 36 % — ABNORMAL LOW (ref 39.0–52.0)
HCT: 26 % — ABNORMAL LOW (ref 39.0–52.0)
HCT: 34 % — ABNORMAL LOW (ref 39.0–52.0)
HCT: 25 % — ABNORMAL LOW (ref 39.0–52.0)
HCT: 26 % — ABNORMAL LOW (ref 39.0–52.0)
Hemoglobin: 13.9 g/dL (ref 13.0–17.0)
Hemoglobin: 12.2 g/dL — ABNORMAL LOW (ref 13.0–17.0)
Hemoglobin: 11.6 g/dL — ABNORMAL LOW (ref 13.0–17.0)
Hemoglobin: 8.8 g/dL — ABNORMAL LOW (ref 13.0–17.0)
Hemoglobin: 8.5 g/dL — ABNORMAL LOW (ref 13.0–17.0)
Hemoglobin: 8.8 g/dL — ABNORMAL LOW (ref 13.0–17.0)
Potassium: 3.4 mmol/L — ABNORMAL LOW (ref 3.5–5.1)
Potassium: 3.9 mmol/L (ref 3.5–5.1)
Potassium: 4.4 mmol/L (ref 3.5–5.1)
Potassium: 4.9 mmol/L (ref 3.5–5.1)
Potassium: 5.4 mmol/L — ABNORMAL HIGH (ref 3.5–5.1)
Potassium: 4.8 mmol/L (ref 3.5–5.1)
Sodium: 140 mmol/L (ref 135–145)
Sodium: 139 mmol/L (ref 135–145)
Sodium: 139 mmol/L (ref 135–145)
Sodium: 139 mmol/L (ref 135–145)
Sodium: 137 mmol/L (ref 135–145)
Sodium: 138 mmol/L (ref 135–145)
TCO2: 23 mmol/L (ref 22–32)
TCO2: 23 mmol/L (ref 22–32)
TCO2: 23 mmol/L (ref 22–32)
TCO2: 27 mmol/L (ref 22–32)
TCO2: 26 mmol/L (ref 22–32)
TCO2: 25 mmol/L (ref 22–32)

## 2019-09-20 LAB — POCT I-STAT 7, (LYTES, BLD GAS, ICA,H+H)
Acid-Base Excess: 0 mmol/L (ref 0.0–2.0)
Acid-base deficit: 4 mmol/L — ABNORMAL HIGH (ref 0.0–2.0)
Acid-base deficit: 3 mmol/L — ABNORMAL HIGH (ref 0.0–2.0)
Bicarbonate: 20.8 mmol/L (ref 20.0–28.0)
Bicarbonate: 24.7 mmol/L (ref 20.0–28.0)
Bicarbonate: 22.9 mmol/L (ref 20.0–28.0)
Calcium, Ion: 1.17 mmol/L (ref 1.15–1.40)
Calcium, Ion: 0.94 mmol/L — ABNORMAL LOW (ref 1.15–1.40)
Calcium, Ion: 1.01 mmol/L — ABNORMAL LOW (ref 1.15–1.40)
HCT: 37 % — ABNORMAL LOW (ref 39.0–52.0)
HCT: 26 % — ABNORMAL LOW (ref 39.0–52.0)
HCT: 28 % — ABNORMAL LOW (ref 39.0–52.0)
Hemoglobin: 12.6 g/dL — ABNORMAL LOW (ref 13.0–17.0)
Hemoglobin: 8.8 g/dL — ABNORMAL LOW (ref 13.0–17.0)
Hemoglobin: 9.5 g/dL — ABNORMAL LOW (ref 13.0–17.0)
O2 Saturation: 100 %
O2 Saturation: 100 %
O2 Saturation: 100 %
Potassium: 3.9 mmol/L (ref 3.5–5.1)
Potassium: 4.8 mmol/L (ref 3.5–5.1)
Potassium: 4.7 mmol/L (ref 3.5–5.1)
Sodium: 139 mmol/L (ref 135–145)
Sodium: 139 mmol/L (ref 135–145)
Sodium: 139 mmol/L (ref 135–145)
TCO2: 22 mmol/L (ref 22–32)
TCO2: 26 mmol/L (ref 22–32)
TCO2: 24 mmol/L (ref 22–32)
pCO2 arterial: 35 mmHg (ref 32.0–48.0)
pCO2 arterial: 37.2 mmHg (ref 32.0–48.0)
pCO2 arterial: 42.2 mmHg (ref 32.0–48.0)
pH, Arterial: 7.381 (ref 7.350–7.450)
pH, Arterial: 7.43 (ref 7.350–7.450)
pH, Arterial: 7.343 — ABNORMAL LOW (ref 7.350–7.450)
pO2, Arterial: 209 mmHg — ABNORMAL HIGH (ref 83.0–108.0)
pO2, Arterial: 356 mmHg — ABNORMAL HIGH (ref 83.0–108.0)
pO2, Arterial: 359 mmHg — ABNORMAL HIGH (ref 83.0–108.0)

## 2019-09-20 LAB — GLUCOSE, CAPILLARY
Glucose-Capillary: 130 mg/dL — ABNORMAL HIGH (ref 70–99)
Glucose-Capillary: 131 mg/dL — ABNORMAL HIGH (ref 70–99)
Glucose-Capillary: 132 mg/dL — ABNORMAL HIGH (ref 70–99)
Glucose-Capillary: 134 mg/dL — ABNORMAL HIGH (ref 70–99)
Glucose-Capillary: 136 mg/dL — ABNORMAL HIGH (ref 70–99)
Glucose-Capillary: 137 mg/dL — ABNORMAL HIGH (ref 70–99)
Glucose-Capillary: 138 mg/dL — ABNORMAL HIGH (ref 70–99)
Glucose-Capillary: 139 mg/dL — ABNORMAL HIGH (ref 70–99)
Glucose-Capillary: 141 mg/dL — ABNORMAL HIGH (ref 70–99)
Glucose-Capillary: 142 mg/dL — ABNORMAL HIGH (ref 70–99)
Glucose-Capillary: 145 mg/dL — ABNORMAL HIGH (ref 70–99)
Glucose-Capillary: 154 mg/dL — ABNORMAL HIGH (ref 70–99)
Glucose-Capillary: 155 mg/dL — ABNORMAL HIGH (ref 70–99)
Glucose-Capillary: 163 mg/dL — ABNORMAL HIGH (ref 70–99)

## 2019-09-20 LAB — COOXEMETRY PANEL
Carboxyhemoglobin: 0.5 % (ref 0.5–1.5)
Methemoglobin: 1 % (ref 0.0–1.5)
O2 Saturation: 72.6 %
Total hemoglobin: 11.1 g/dL — ABNORMAL LOW (ref 12.0–16.0)

## 2019-09-20 LAB — MAGNESIUM
Magnesium: 2.4 mg/dL (ref 1.7–2.4)
Magnesium: 2.5 mg/dL — ABNORMAL HIGH (ref 1.7–2.4)

## 2019-09-20 MED ORDER — SODIUM CHLORIDE 0.9% FLUSH: 10.0000 mL | INTRAVENOUS | Status: DC | PRN

## 2019-09-20 MED ORDER — CARVEDILOL 6.25 MG PO TABS
6.2500 mg | ORAL_TABLET | Freq: Two times a day (BID) | ORAL | Status: DC
  Administered 2019-09-20 – 2019-09-24 (×8): 6.25 mg via ORAL
  Filled 2019-09-20 (×8): qty 1

## 2019-09-20 MED ORDER — ORAL CARE MOUTH RINSE
15.0000 mL | Freq: Two times a day (BID) | OROMUCOSAL | Status: DC
  Administered 2019-09-20 – 2019-09-23 (×6): 15 mL via OROMUCOSAL

## 2019-09-20 MED ORDER — SODIUM CHLORIDE 0.9% FLUSH
10.0000 mL | Freq: Two times a day (BID) | INTRAVENOUS | Status: DC
  Administered 2019-09-20 – 2019-09-22 (×4): 10 mL

## 2019-09-20 MED ORDER — INSULIN ASPART 100 UNIT/ML ~~LOC~~ SOLN
0.0000 [IU] | SUBCUTANEOUS | Status: DC
  Administered 2019-09-20: 2 [IU] via SUBCUTANEOUS
  Administered 2019-09-20: 4 [IU] via SUBCUTANEOUS
  Administered 2019-09-20 – 2019-09-21 (×3): 2 [IU] via SUBCUTANEOUS

## 2019-09-20 MED FILL — Nitroglycerin IV Soln 100 MCG/ML in D5W: INTRA_ARTERIAL | Qty: 10 | Status: AC

## 2019-09-20 NOTE — Addendum Note (Signed)
Addendum  created 09/20/19 0610 by Adair Laundry, CRNA   Order list changed

## 2019-09-20 NOTE — Progress Notes (Signed)
Cath Lab nurse Greggory Stallion RN at bedside. ACT noted 120. IABP removed at beside. Manual pressure held for . Site level 0. +2 pedal pulses noted. Vital signs stable. Pt education of post IABP removal procedure and care.

## 2019-09-20 NOTE — Progress Notes (Signed)
CT surgery p.m. Rounds  Patient had stable day, balloon pump removed this a.m. Patient was up in chair for several hours P.m. labs reviewed and are satisfactory No groin hematoma at wound site  will start Lasix diuresis tomorrow

## 2019-09-20 NOTE — Progress Notes (Signed)
Progress Note  Patient Name: [name] Date of Encounter: 09/20/2019  Primary Cardiologist: No primary care provider on file. New- Dr Daneen Schick  Subjective   Patient states he feels sore. No angina. No dyspnea.   Inpatient Medications    Scheduled Meds: . acetaminophen  1,000 mg Oral Q6H   Or  . acetaminophen (TYLENOL) oral liquid 160 mg/5 mL  1,000 mg Per Tube Q6H  . aspirin EC  325 mg Oral Daily   Or  . aspirin  324 mg Per Tube Daily  . bisacodyl  10 mg Oral Daily   Or  . bisacodyl  10 mg Rectal Daily  . Chlorhexidine Gluconate Cloth  6 each Topical q morning - 10a  . docusate sodium  200 mg Oral Daily  . insulin aspart  0-24 Units Subcutaneous Q4H  . mouth rinse  15 mL Mouth Rinse BID  . metoprolol tartrate  12.5 mg Oral BID   Or  . metoprolol tartrate  12.5 mg Per Tube BID  . [START ON 09/21/2019] pantoprazole  40 mg Oral Daily  . sodium chloride flush  10-40 mL Intracatheter Q12H  . sodium chloride flush  3 mL Intravenous Once  . sodium chloride flush  3 mL Intravenous Q12H  . sodium chloride flush  3 mL Intravenous Q12H   Continuous Infusions: . sodium chloride 10 mL/hr at 09/20/19 0700  . sodium chloride 20 mL/hr at 09/20/19 0400  . sodium chloride    . sodium chloride    . sodium chloride 10 mL/hr at 09/20/19 0400  . albumin human 250 mL/hr at 09/20/19 0400  . cefUROXime (ZINACEF)  IV Stopped (09/20/19 0537)  . dexmedetomidine (PRECEDEX) IV infusion Stopped (09/19/19 1706)  . DOPamine 3 mcg/kg/min (09/20/19 0700)  . famotidine (PEPCID) IV Stopped (09/19/19 1609)  . insulin 1.7 mL/hr at 09/20/19 0700  . lactated ringers    . lactated ringers    . lactated ringers 20 mL/hr at 09/20/19 0700  . milrinone 0.3 mcg/kg/min (09/20/19 0700)  . nitroGLYCERIN Stopped (09/19/19 1657)  . phenylephrine (NEO-SYNEPHRINE) Adult infusion Stopped (09/19/19 2325)   PRN Meds: sodium chloride, sodium chloride, acetaminophen, albumin human, dextrose, fentaNYL (SUBLIMAZE)  injection, lactated ringers, metoprolol tartrate, midazolam, nitroGLYCERIN, ondansetron (ZOFRAN) IV, oxyCODONE, sodium chloride flush, sodium chloride flush, sodium chloride flush, traMADol   Vital Signs    Vitals:   09/20/19 0615 09/20/19 0630 09/20/19 0645 09/20/19 0700  BP:  117/68  115/76  Pulse: 66 (!) 55  (!) 106  Resp: 16 18 11 18   Temp: 99 F (37.2 C) 99 F (37.2 C) 98.8 F (37.1 C) 98.8 F (37.1 C)  TempSrc:      SpO2: 96% 98%  92%  Weight:      Height:        Intake/Output Summary (Last 24 hours) at 09/20/2019 0729 Last data filed at 09/20/2019 0700 Gross per 24 hour  Intake 6627.62 ml  Output 3015 ml  Net 3612.62 ml   Last 3 Weights 09/19/2019  Weight (lbs) 228 lb  Weight (kg) 103.42 kg      Telemetry    NSR occ PVC - Personally Reviewed  ECG    NSR with septal Q waves. Mild ST elevation persists in V2 - Personally Reviewed  Physical Exam   GEN: No acute distress.   Neck: No JVD, IJ line in place Cardiac: RRR, no murmurs, rubs, or gallops.  Respiratory: Clear to auscultation anteriorly. GI: Soft, nontender, non-distended  MS: No edema;  No deformity. IABP in place.  Neuro:  Nonfocal  Psych: Normal affect   Labs    High Sensitivity Troponin:   Recent Labs  Lab 09/19/19 0643  TROPONINIHS 41*      Chemistry Recent Labs  Lab 09/19/19 0643 09/19/19 1530 09/19/19 1948 09/19/19 2129 09/20/19 0314  NA 140   < > 141 139 139  K 3.5   < > 3.6 4.3 4.7  CL 105  --   --  111 111  CO2 21*  --   --  20* 21*  GLUCOSE 154*  --   --  139* 155*  BUN 16  --   --  12 13  CREATININE 1.00  --   --  0.89 1.02  CALCIUM 9.1  --   --  7.0* 7.2*  GFRNONAA >60  --   --  >60 >60  GFRAA >60  --   --  >60 >60  ANIONGAP 14  --   --  8 7   < > = values in this interval not displayed.     Hematology Recent Labs  Lab 09/19/19 1532 09/19/19 1641 09/19/19 1948 09/19/19 2129 09/20/19 0314  WBC 16.5*  --   --  13.0* 12.3*  RBC 3.97*  --   --  3.68* 3.54*  HGB  12.4*   < > 10.5* 11.5* 10.9*  HCT 36.6*   < > 31.0* 33.8* 32.8*  MCV 92.2  --   --  91.8 92.7  MCH 31.2  --   --  31.3 30.8  MCHC 33.9  --   --  34.0 33.2  RDW 12.4  --   --  12.5 12.6  PLT 173  --   --  144* 130*   < > = values in this interval not displayed.    BNPNo results for input(s): BNP, PROBNP in the last 168 hours.   DDimer No results for input(s): DDIMER in the last 168 hours.   Radiology    CARDIAC CATHETERIZATION  Result Date: 09/19/2019  Anterior ST elevation myocardial infarction  99% trifurcating proximal LAD, diagonal, septal perforator with TIMI grade I-II flow initially improving to TIMI grade III after a wire in the anterior descending, IV heparin, and intracoronary nitroglycerin.  Became totally pain-free.  Left main is widely patent  Circumflex is widely patent  RCA with eccentric distal 40 to 50% narrowing  Mid anterior wall akinesis.  EF 45%.  LVEDP 16.  Insertion of intra-aortic balloon pump RECOMMENDATIONS:  After consideration, revascularization with urgent coronary surgery due to anatomic subset that is not favorable for PCI is recommended..  Intra-aortic balloon pump to improve stability  IV heparin keeping ACT greater than 200.  DG Chest Port 1 View  Result Date: 09/19/2019 CLINICAL DATA:  Status post coronary bypass grafting EXAM: PORTABLE CHEST 1 VIEW COMPARISON:  Film from earlier in the same day. FINDINGS: Postsurgical changes are now seen consistent with the given clinical history. Endotracheal tube, gastric catheter, mediastinal drain and left thoracostomy tube are noted. The gastric catheter shows the tip in the stomach although the proximal side port lies in the distal esophagus and should be advanced several cm. Swan-Ganz catheter is noted in the right pulmonary artery. Inspiratory effort is poor although no pneumothorax or focal infiltrate is seen. Aortic balloon pump is noted within the descending aorta just below the aortic knob. IMPRESSION:  Tubes and lines as described above. Gastric catheter should be advanced several cm further into the stomach. Intra-aortic  balloon pump in the descending aorta. No pneumothorax is seen. Electronically Signed   By: Alcide Clever M.D.   On: 09/19/2019 16:02   DG Chest Port 1 View  Result Date: 09/19/2019 CLINICAL DATA:  Chest pressure, worse this morning EXAM: PORTABLE CHEST 1 VIEW COMPARISON:  None FINDINGS: Mild nonspecific elevation of the right hemidiaphragm. Slight streaky basilar opacities likely reflecting atelectasis. No consolidation, features of edema, pneumothorax, or effusion. The cardiomediastinal contours are unremarkable. No acute osseous or soft tissue abnormality. Telemetry leads and pacer pads overlie the chest. IMPRESSION: Minimal basilar atelectasis. No other acute cardiopulmonary abnormality. Electronically Signed   By: Kreg Shropshire M.D.   On: 09/19/2019 06:57    Cardiac Studies   CORONARY BALLOON ANGIOPLASTY  IABP Insertion  LEFT HEART CATH AND CORONARY ANGIOGRAPHY  Conclusion   Anterior ST elevation myocardial infarction  99% trifurcating proximal LAD, diagonal, septal perforator with TIMI grade I-II flow initially improving to TIMI grade III after a wire in the anterior descending, IV heparin, and intracoronary nitroglycerin.  Became totally pain-free.  Left main is widely patent  Circumflex is widely patent  RCA with eccentric distal 40 to 50% narrowing  Mid anterior wall akinesis.  EF 45%.  LVEDP 16.  Insertion of intra-aortic balloon pump  RECOMMENDATIONS:   After consideration, revascularization with urgent coronary surgery due to anatomic subset that is not favorable for PCI is recommended..  Intra-aortic balloon pump to improve stability  IV heparin keeping ACT greater than 200.     Patient Profile     59 y.o. male with no prior medical history presented with acute anteroseptal STEMI  Assessment & Plan    1. Anteroseptal STEMI. Acute  reperfusion with wire crossing of LAD. IABP placed. Anatomy not favorable for PCI. Patient underwent emergent CABG yesterday with LIMA to LAD and SVG to diagonal. LAD noted to be intramyocardial. Now extubated. On Milrinone and dopamine. IABP at 1:1. EF approximately 45% with anterior wall motion abnormality. On ASA, statin. SSI. Wean pressors and IABP as tolerated today. Once hemodynamics improved would start beta blocker with Coreg and ARB. In setting of STEMI would add Plavix when OK with CT surgery. Would check Transthoracic Echo prior to DC. 2. Hypercholesterolemia. LDL 130. Goal < 70. Recommend high dose statin. 3. Hyperglycemia. A1c 6.3%. on SSI. May need to consider metformin at DC. Discuss low carb diet.  4. Acute systolic CHF. EF 45%. Reassess with Echo prior to DC.       For questions or updates, please contact [facility] Please consult [email or web address] for contact info under        Signed, Tenecia Ignasiak [place], MD  09/20/2019, 7:29 AM

## 2019-09-20 NOTE — Progress Notes (Signed)
Patient ID: [name], male   DOB: [birth year], 59 y.o.   MRN: [id number] [facility] DAILY ICU PROGRESS NOTE                   [address]            [name] [address]          [phone]   1 Day Post-Op Procedure(s) (LRB): CORONARY ARTERY BYPASS GRAFTING (CABG) using LIMA to LAD; Endoscopic Right Greater Saphenous Vein Harvest: SVG to Diag. (N/A) TRANSESOPHAGEAL ECHOCARDIOGRAM (TEE) (N/A) Endovein Harvest Of Greater Saphenous Vein (Right)  Total Length of Stay:  LOS: 1 day   Subjective: Patient awake alert neurologically intact extubated last night without difficulty, had nausea and vomiting with IV morphine  Objective: Vital signs in last 24 hours: Temp:  [98.1 F (36.7 C)-99 F (37.2 C)] 99 F (37.2 C) (05/26 0600) Pulse Rate:  [30-231] 63 (05/26 0600) Cardiac Rhythm: Atrial paced (05/26 0000) Resp:  [10-26] 19 (05/26 0600) BP: (91-177)/(53-125) 107/65 (05/26 0600) SpO2:  [0 %-100 %] 96 % (05/26 0600) Arterial Line BP: (72-134)/(47-73) 128/72 (05/26 0600) FiO2 (%):  [40 %-100 %] 40 % (05/25 1817)  Filed Weights   09/19/19 0640  Weight: 103.4 kg    Weight change:    Hemodynamic parameters for last 24 hours: PAP: (20-43)/(12-37) 40/21 CO:  [5.8 L/min-7.8 L/min] 7.8 L/min CI:  [2.6 L/min/m2-3.5 L/min/m2] 3.5 L/min/m2  Intake/Output from previous day: 05/25 0701 - 05/26 0700 In: 6581 [I.V.:4082.8; Blood:600; IV Piggyback:1898.2] Out: 2970 [Urine:2170; Blood:400; Chest Tube:400]  Intake/Output this shift: Total I/O In: 2043.8 [I.V.:1356.9; IV Piggyback:686.9] Out: 850 [Urine:570; Chest Tube:280]  Current Meds: Scheduled Meds: . acetaminophen  1,000 mg Oral Q6H   Or  . acetaminophen (TYLENOL) oral liquid 160 mg/5 mL  1,000 mg Per Tube Q6H  . aspirin EC  325 mg Oral Daily   Or  . aspirin  324 mg Per Tube Daily  . bisacodyl  10 mg Oral Daily   Or  . bisacodyl  10 mg Rectal Daily  . Chlorhexidine Gluconate Cloth  6 each Topical q morning - 10a   . docusate sodium  200 mg Oral Daily  . mouth rinse  15 mL Mouth Rinse BID  . metoprolol tartrate  12.5 mg Oral BID   Or  . metoprolol tartrate  12.5 mg Per Tube BID  . [START ON 09/21/2019] pantoprazole  40 mg Oral Daily  . sodium chloride flush  10-40 mL Intracatheter Q12H  . sodium chloride flush  3 mL Intravenous Once  . sodium chloride flush  3 mL Intravenous Q12H  . sodium chloride flush  3 mL Intravenous Q12H   Continuous Infusions: . sodium chloride 10 mL/hr at 09/20/19 0600  . sodium chloride 20 mL/hr at 09/20/19 0400  . sodium chloride    . sodium chloride    . sodium chloride 10 mL/hr at 09/20/19 0400  . albumin human 250 mL/hr at 09/20/19 0400  . cefUROXime (ZINACEF)  IV Stopped (09/20/19 0537)  . dexmedetomidine (PRECEDEX) IV infusion Stopped (09/19/19 1706)  . DOPamine 3 mcg/kg/min (09/20/19 0600)  . famotidine (PEPCID) IV Stopped (09/19/19 1609)  . insulin 1.5 mL/hr at 09/20/19 0600  . lactated ringers    . lactated ringers    . lactated ringers 20 mL/hr at 09/20/19 0600  . milrinone 0.3 mcg/kg/min (09/20/19 0600)  . nitroGLYCERIN Stopped (09/19/19 1657)  . phenylephrine (NEO-SYNEPHRINE) Adult infusion Stopped (09/19/19 2325)   PRN  Meds:.sodium chloride, sodium chloride, acetaminophen, albumin human, dextrose, fentaNYL (SUBLIMAZE) injection, lactated ringers, metoprolol tartrate, midazolam, nitroGLYCERIN, ondansetron (ZOFRAN) IV, oxyCODONE, sodium chloride flush, sodium chloride flush, sodium chloride flush, traMADol  General appearance: alert, cooperative and no distress Neurologic: intact Heart: regular rate and rhythm, S1, S2 normal, no murmur, click, rub or gallop Lungs: diminished breath sounds bibasilar Abdomen: soft, non-tender; bowel sounds normal; no masses,  no organomegaly Extremities: extremities normal, atraumatic, no cyanosis or edema and Homans sign is negative, no sign of DVT Wound: Sternum stable dressing intact  Lab Results: CBC: Recent Labs     09/19/19 2129 09/20/19 0314  WBC 13.0* 12.3*  HGB 11.5* 10.9*  HCT 33.8* 32.8*  PLT 144* 130*   BMET:  Recent Labs    09/19/19 2129 09/20/19 0314  NA 139 139  K 4.3 4.7  CL 111 111  CO2 20* 21*  GLUCOSE 139* 155*  BUN 12 13  CREATININE 0.89 1.02  CALCIUM 7.0* 7.2*    CMET: Lab Results  Component Value Date   WBC 12.3 (H) 09/20/2019   HGB 10.9 (L) 09/20/2019   HCT 32.8 (L) 09/20/2019   PLT 130 (L) 09/20/2019   GLUCOSE 155 (H) 09/20/2019   CHOL 203 (H) 09/19/2019   TRIG 146 09/19/2019   HDL 43 09/19/2019   LDLCALC 131 (H) 09/19/2019   NA 139 09/20/2019   K 4.7 09/20/2019   CL 111 09/20/2019   CREATININE 1.02 09/20/2019   BUN 13 09/20/2019   CO2 21 (L) 09/20/2019   INR 1.4 (H) 09/19/2019   HGBA1C 6.3 (H) 09/19/2019    COOX this morning on balloon 1-3 73  PT/INR:  Recent Labs    09/19/19 1532  LABPROT 17.0*  INR 1.4*   Radiology: CARDIAC CATHETERIZATION  Result Date: 09/19/2019  Anterior ST elevation myocardial infarction  99% trifurcating proximal LAD, diagonal, septal perforator with TIMI grade I-II flow initially improving to TIMI grade III after a wire in the anterior descending, IV heparin, and intracoronary nitroglycerin.  Became totally pain-free.  Left main is widely patent  Circumflex is widely patent  RCA with eccentric distal 40 to 50% narrowing  Mid anterior wall akinesis.  EF 45%.  LVEDP 16.  Insertion of intra-aortic balloon pump RECOMMENDATIONS:  After consideration, revascularization with urgent coronary surgery due to anatomic subset that is not favorable for PCI is recommended..  Intra-aortic balloon pump to improve stability  IV heparin keeping ACT greater than 200.  DG Chest Port 1 View  Result Date: 09/19/2019 CLINICAL DATA:  Status post coronary bypass grafting EXAM: PORTABLE CHEST 1 VIEW COMPARISON:  Film from earlier in the same day. FINDINGS: Postsurgical changes are now seen consistent with the given clinical history.  Endotracheal tube, gastric catheter, mediastinal drain and left thoracostomy tube are noted. The gastric catheter shows the tip in the stomach although the proximal side port lies in the distal esophagus and should be advanced several cm. Swan-Ganz catheter is noted in the right pulmonary artery. Inspiratory effort is poor although no pneumothorax or focal infiltrate is seen. Aortic balloon pump is noted within the descending aorta just below the aortic knob. IMPRESSION: Tubes and lines as described above. Gastric catheter should be advanced several cm further into the stomach. Intra-aortic balloon pump in the descending aorta. No pneumothorax is seen. Electronically Signed   By: Inez Catalina M.D.   On: 09/19/2019 16:02   DG Chest Port 1 View  Result Date: 09/19/2019 CLINICAL DATA:  Chest pressure, worse this  morning EXAM: PORTABLE CHEST 1 VIEW COMPARISON:  None FINDINGS: Mild nonspecific elevation of the right hemidiaphragm. Slight streaky basilar opacities likely reflecting atelectasis. No consolidation, features of edema, pneumothorax, or effusion. The cardiomediastinal contours are unremarkable. No acute osseous or soft tissue abnormality. Telemetry leads and pacer pads overlie the chest. IMPRESSION: Minimal basilar atelectasis. No other acute cardiopulmonary abnormality. Electronically Signed   By: Kreg Shropshire M.D.   On: 09/19/2019 06:57     Assessment/Plan: S/P Procedure(s) (LRB): CORONARY ARTERY BYPASS GRAFTING (CABG) using LIMA to LAD; Endoscopic Right Greater Saphenous Vein Harvest: SVG to Diag. (N/A) TRANSESOPHAGEAL ECHOCARDIOGRAM (TEE) (N/A) Endovein Harvest Of Greater Saphenous Vein (Right) Mobilize Diabetes control Intra-aortic balloon pump is now on 1-3 will be discontinued this morning After balloon pump out consider the landing and removal of mediastinal tube Wean off insulin drip convert to sliding scale Expected Acute  Blood - loss Anemia- continue to monitor   Delight Ovens 09/20/2019 6:44 AM

## 2019-09-21 ENCOUNTER — Inpatient Hospital Stay (HOSPITAL_COMMUNITY): Payer: 59

## 2019-09-21 LAB — CBC
HCT: 32.4 % — ABNORMAL LOW (ref 39.0–52.0)
Hemoglobin: 10.6 g/dL — ABNORMAL LOW (ref 13.0–17.0)
MCH: 31.2 pg (ref 26.0–34.0)
MCHC: 32.7 g/dL (ref 30.0–36.0)
MCV: 95.3 fL (ref 80.0–100.0)
Platelets: 126 10*3/uL — ABNORMAL LOW (ref 150–400)
RBC: 3.4 MIL/uL — ABNORMAL LOW (ref 4.22–5.81)
RDW: 13.2 % (ref 11.5–15.5)
WBC: 13.9 10*3/uL — ABNORMAL HIGH (ref 4.0–10.5)
nRBC: 0 % (ref 0.0–0.2)

## 2019-09-21 LAB — GLUCOSE, CAPILLARY
Glucose-Capillary: 135 mg/dL — ABNORMAL HIGH (ref 70–99)
Glucose-Capillary: 116 mg/dL — ABNORMAL HIGH (ref 70–99)
Glucose-Capillary: 119 mg/dL — ABNORMAL HIGH (ref 70–99)
Glucose-Capillary: 89 mg/dL (ref 70–99)
Glucose-Capillary: 108 mg/dL — ABNORMAL HIGH (ref 70–99)

## 2019-09-21 LAB — COOXEMETRY PANEL
Carboxyhemoglobin: 1.2 % (ref 0.5–1.5)
Methemoglobin: 1.2 % (ref 0.0–1.5)
O2 Saturation: 64.8 %
Total hemoglobin: 10.8 g/dL — ABNORMAL LOW (ref 12.0–16.0)

## 2019-09-21 LAB — BASIC METABOLIC PANEL
Anion gap: 9 (ref 5–15)
BUN: 21 mg/dL — ABNORMAL HIGH (ref 6–20)
CO2: 21 mmol/L — ABNORMAL LOW (ref 22–32)
Calcium: 7.6 mg/dL — ABNORMAL LOW (ref 8.9–10.3)
Chloride: 106 mmol/L (ref 98–111)
Creatinine, Ser: 0.99 mg/dL (ref 0.61–1.24)
GFR calc Af Amer: >60 mL/min (ref >60)
GFR calc non Af Amer: >60 mL/min (ref >60)
Glucose, Bld: 129 mg/dL — ABNORMAL HIGH (ref 70–99)
Potassium: 4.3 mmol/L (ref 3.5–5.1)
Sodium: 136 mmol/L (ref 135–145)

## 2019-09-21 MED ORDER — ENOXAPARIN SODIUM 40 MG/0.4ML ~~LOC~~ SOLN
40.0000 mg | SUBCUTANEOUS | Status: DC
  Administered 2019-09-21 – 2019-09-24 (×4): 40 mg via SUBCUTANEOUS
  Filled 2019-09-21 (×4): qty 0.4

## 2019-09-21 MED ORDER — FUROSEMIDE 10 MG/ML IJ SOLN
40.0000 mg | Freq: Once | INTRAMUSCULAR | Status: AC
  Administered 2019-09-21: 40 mg via INTRAVENOUS
  Filled 2019-09-21: qty 4

## 2019-09-21 MED ORDER — LISINOPRIL 2.5 MG PO TABS
2.5000 mg | ORAL_TABLET | Freq: Every day | ORAL | Status: DC
  Administered 2019-09-21 – 2019-09-24 (×4): 2.5 mg via ORAL
  Filled 2019-09-21 (×4): qty 1

## 2019-09-21 MED ORDER — INSULIN ASPART 100 UNIT/ML ~~LOC~~ SOLN
0.0000 [IU] | Freq: Three times a day (TID) | SUBCUTANEOUS | Status: DC
  Administered 2019-09-22: 2 [IU] via SUBCUTANEOUS

## 2019-09-21 MED ORDER — ATORVASTATIN CALCIUM 80 MG PO TABS
80.0000 mg | ORAL_TABLET | Freq: Every day | ORAL | Status: DC
  Administered 2019-09-21 – 2019-09-24 (×4): 80 mg via ORAL
  Filled 2019-09-21 (×4): qty 1

## 2019-09-21 MED FILL — Lidocaine HCl Local Soln Prefilled Syringe 100 MG/5ML (2%): INTRAMUSCULAR | Qty: 5 | Status: AC

## 2019-09-21 MED FILL — Sodium Bicarbonate IV Soln 8.4%: INTRAVENOUS | Qty: 50 | Status: AC

## 2019-09-21 MED FILL — Electrolyte-R (PH 7.4) Solution: INTRAVENOUS | Qty: 6000 | Status: AC

## 2019-09-21 MED FILL — Magnesium Sulfate Inj 50%: INTRAMUSCULAR | Qty: 10 | Status: AC

## 2019-09-21 MED FILL — Mannitol IV Soln 20%: INTRAVENOUS | Qty: 500 | Status: AC

## 2019-09-21 MED FILL — Heparin Sodium (Porcine) Inj 1000 Unit/ML: INTRAMUSCULAR | Qty: 30 | Status: AC

## 2019-09-21 MED FILL — Heparin Sodium (Porcine) Inj 1000 Unit/ML: INTRAMUSCULAR | Qty: 2500 | Status: AC

## 2019-09-21 MED FILL — Heparin Sodium (Porcine) Inj 1000 Unit/ML: INTRAMUSCULAR | Qty: 10 | Status: AC

## 2019-09-21 MED FILL — Sodium Chloride IV Soln 0.9%: INTRAVENOUS | Qty: 3000 | Status: AC

## 2019-09-21 MED FILL — Potassium Chloride Inj 2 mEq/ML: INTRAVENOUS | Qty: 40 | Status: AC

## 2019-09-21 MED FILL — Vancomycin HCl For IV Soln 1 GM (Base Equivalent): INTRAVENOUS | Qty: 1000 | Status: AC

## 2019-09-21 NOTE — Progress Notes (Signed)
Patient ID: [name], male   DOB: January 08, 1961, 59 y.o.   MRN: [id number] [facility] DAILY ICU PROGRESS NOTE                   [address]            [name] [address]          [phone]   2 Days Post-Op Procedure(s) (LRB): CORONARY ARTERY BYPASS GRAFTING (CABG) using LIMA to LAD; Endoscopic Right Greater Saphenous Vein Harvest: SVG to Diag. (N/A) TRANSESOPHAGEAL ECHOCARDIOGRAM (TEE) (N/A) Endovein Harvest Of Greater Saphenous Vein (Right)  Total Length of Stay:  LOS: 2 days   Subjective: Patient awake alert neurologically intact sitting in chair, feels better today better pain control  Objective: Vital signs in last 24 hours: Temp:  [98.3 F (36.8 C)-99.3 F (37.4 C)] 99.3 F (37.4 C) (05/27 0645) Pulse Rate:  [97-141] 97 (05/27 0726) Cardiac Rhythm: Sinus tachycardia (05/26 2000) Resp:  [9-27] 17 (05/27 0500) BP: (120-146)/(65-85) 134/79 (05/27 0726) SpO2:  [91 %-97 %] 93 % (05/27 0500) Arterial Line BP: (94-160)/(52-82) 160/60 (05/26 1800) Weight:  [111.2 kg] 111.2 kg (05/27 0500)  Filed Weights   09/19/19 0640 09/21/19 0500  Weight: 103.4 kg 111.2 kg    Weight change:    Hemodynamic parameters for last 24 hours: PAP: (30-42)/(19-32) 34/24  Intake/Output from previous day: 05/26 0701 - 05/27 0700 In: 1427 [P.O.:480; I.V.:800.1; IV Piggyback:146.9] Out: 1235 [Urine:705; Chest Tube:530]  Intake/Output this shift: No intake/output data recorded.  Current Meds: Scheduled Meds: . acetaminophen  1,000 mg Oral Q6H   Or  . acetaminophen (TYLENOL) oral liquid 160 mg/5 mL  1,000 mg Per Tube Q6H  . aspirin EC  325 mg Oral Daily   Or  . aspirin  324 mg Per Tube Daily  . bisacodyl  10 mg Oral Daily   Or  . bisacodyl  10 mg Rectal Daily  . carvedilol  6.25 mg Oral BID WC  . Chlorhexidine Gluconate Cloth  6 each Topical q morning - 10a  . docusate sodium  200 mg Oral Daily  . insulin aspart  0-24 Units Subcutaneous Q4H  . mouth rinse  15 mL Mouth  Rinse BID  . pantoprazole  40 mg Oral Daily  . sodium chloride flush  10-40 mL Intracatheter Q12H  . sodium chloride flush  3 mL Intravenous Once  . sodium chloride flush  3 mL Intravenous Q12H  . sodium chloride flush  3 mL Intravenous Q12H   Continuous Infusions: . sodium chloride Stopped (09/20/19 1001)  . sodium chloride 20 mL/hr at 09/20/19 0400  . sodium chloride    . sodium chloride    . sodium chloride 10 mL/hr at 09/20/19 0400  . dexmedetomidine (PRECEDEX) IV infusion Stopped (09/19/19 1706)  . DOPamine 2 mcg/kg/min (09/21/19 0600)  . insulin Stopped (09/20/19 1344)  . lactated ringers    . lactated ringers    . lactated ringers Stopped (09/21/19 0545)  . milrinone 0.3 mcg/kg/min (09/21/19 0600)  . nitroGLYCERIN Stopped (09/19/19 1657)  . phenylephrine (NEO-SYNEPHRINE) Adult infusion Stopped (09/19/19 2325)   PRN Meds:.sodium chloride, sodium chloride, acetaminophen, dextrose, fentaNYL (SUBLIMAZE) injection, lactated ringers, metoprolol tartrate, midazolam, nitroGLYCERIN, ondansetron (ZOFRAN) IV, oxyCODONE, sodium chloride flush, sodium chloride flush, sodium chloride flush, traMADol  General appearance: alert, cooperative and no distress Neurologic: intact Heart: regular rate and rhythm, S1, S2 normal, no murmur, click, rub or gallop Lungs: diminished breath sounds bibasilar Abdomen: soft, non-tender; bowel sounds  normal; no masses,  no organomegaly Extremities: extremities normal, atraumatic, no cyanosis or edema and Homans sign is negative, no sign of DVT Wound: Sternal dressing intact sternum stable  Lab Results: CBC: Recent Labs    09/20/19 1546 09/21/19 0500  WBC 17.4* 13.9*  HGB 11.3* 10.6*  HCT 33.9* 32.4*  PLT 133* 126*   BMET:  Recent Labs    09/20/19 1546 09/21/19 0500  NA 136 136  K 4.6 4.3  CL 108 106  CO2 21* 21*  GLUCOSE 167* 129*  BUN 17 21*  CREATININE 1.16 0.99  CALCIUM 7.4* 7.6*    CMET: Lab Results  Component Value Date   WBC  13.9 (H) 09/21/2019   HGB 10.6 (L) 09/21/2019   HCT 32.4 (L) 09/21/2019   PLT 126 (L) 09/21/2019   GLUCOSE 129 (H) 09/21/2019   CHOL 203 (H) 09/19/2019   TRIG 146 09/19/2019   HDL 43 09/19/2019   LDLCALC 131 (H) 09/19/2019   NA 136 09/21/2019   K 4.3 09/21/2019   CL 106 09/21/2019   CREATININE 0.99 09/21/2019   BUN 21 (H) 09/21/2019   CO2 21 (L) 09/21/2019   INR 1.4 (H) 09/19/2019   HGBA1C 6.3 (H) 09/19/2019   [name] 64   PT/INR:  Recent Labs    09/19/19 1532  LABPROT 17.0*  INR 1.4*   Radiology: No results found.   Assessment/Plan: S/P Procedure(s) (LRB): CORONARY ARTERY BYPASS GRAFTING (CABG) using LIMA to LAD; Endoscopic Right Greater Saphenous Vein Harvest: SVG to Diag. (N/A) TRANSESOPHAGEAL ECHOCARDIOGRAM (TEE) (N/A) Endovein Harvest Of Greater Saphenous Vein (Right) Mobilize Diuresis Diabetes control d/c tubes/lines Wean dopamine off today  Add low dose ace as cr stable  On statin and coreg    Grace Isaac 09/21/2019 7:39 AM

## 2019-09-21 NOTE — Progress Notes (Signed)
      [address]       [place] [address]             [phone]      POD # 2 CABG  Up in chair  BP (!) 104/97   Pulse (!) 103   Temp 98.5 F (36.9 C)   Resp (!) 24   Ht 5\' 11"  (1.803 m)   Wt 111.2 kg   SpO2 91%   BMI 34.19 kg/m  Milrinone 0.2, off dopamine   Intake/Output Summary (Last 24 hours) at 09/21/2019 1811 Last data filed at 09/21/2019 1600 Gross per 24 hour  Intake 782.22 ml  Output 2565 ml  Net -1782.78 ml   No PM labs, CBG well controlled  09/23/2019 C. Viviann Spare, MD [facility] [phone]

## 2019-09-21 NOTE — Addendum Note (Signed)
Addendum  created 09/21/19 7357 by Kipp Brood, MD   Clinical Note Signed

## 2019-09-21 NOTE — Op Note (Signed)
[name], [name] MEDICAL RECORD [id number] ACCOUNT [id number] DATE OF BIRTH:[birth year] FACILITY: [facility] LOCATION: [facility] PHYSICIAN:Dong Nimmons BTyrone Sage, MD  OPERATIVE REPORT  DATE OF PROCEDURE:  09/19/2019  PREOPERATIVE DIAGNOSIS:  Presentation with ST segment elevation myocardial infarction involving left anterior descending and diagonal.  POSTOPERATIVE DIAGNOSIS:  Presentation with ST segment elevation myocardial infarction involving left anterior descending and diagonal.  SURGICAL PROCEDURE:   1.  Emergency coronary artery bypass grafting x2 with the left internal mammary to the left anterior descending coronary artery and reverse saphenous vein graft to the diagonal coronary artery. 2.  Right thigh Endovein harvesting of the greater saphenous vein.  SURGEON:  Sheliah Plane, MD  FIRST ASSISTANT:  Jari Favre, PA.  BRIEF HISTORY:  The patient is a 59 year old male with a significant family history of coronary artery disease who presents with an approximately 40-month history of stuttering chest pain.  However, on the morning of surgery, he awoke to get ready to go  to work and had onset of significant unrelieving chest pain, came to the emergency room and was taken directly to the cath lab.  He underwent cardiac catheterization by Dr. Verdis Prime that demonstrated subtotal occlusion of the diagonal LAD system with  a large right coronary system without significant disease and patent circumflex system.  Dr. Cornelius Moras was called stat to the cath lab.  Due to the critical findings that could not be dealt with angioplasty, intraaortic balloon pump was placed and planned emergency CABG.  To avoid delays in surgery,  I saw the patient, discussed with he and his wife proceeding with emergency coronary artery bypass.  When seeing the patient, he did note that I done bypass surgery on his father many years ago, who is now 40 years old and his brother.  He was  agreeable and we proceeded directly  from the cath lab to the OR.  Lines were placed, intraaortic balloon pump had been placed in the cath lab.  DESCRIPTION OF PROCEDURE:  The patient underwent general endotracheal anesthesia.  A left radial arterial line was placed.  Swan-Ganz was placed.  TEE was placed.  The patient was prepped with Betadine, draped in a sterile manner.  Appropriate timeout  was performed.  We then proceeded with harvesting of the right greater saphenous vein endoscopically.  Median sternotomy was performed.  Left internal mammary artery was dissected down as a pedicle graft.  The distal artery was divided, good free flow.   Pericardium was opened.  The patient had significant left ventricular hypertrophy, normal size aorta.  He was systemically heparinized.  The ascending aorta was cannulated.  The right atrium was cannulated.  An aortic root vent cardioplegia needle was  introduced into the ascending aorta.  The patient was placed on cardiopulmonary bypass 2.4 L per minute per meter square.  We then turned our attention to the LAD and diagonal.  The LAD at a bifurcation place was of sufficient size to dilate, although  the cath film suggested aneurysmal dilatation of the diagonal.    What was seen on the surface of the heart did not appear aneurysmal.  The left anterior descending coronary artery was intramyocardial and took extensive dissection into the epicardial fat  and myocardium, but was located.  The patient's body temperature was cooled to 32 degrees.  Aortic crossclamp was applied.  Five hundred mL of cold blood potassium cardioplegia was administered with diastolic arrest of the heart.  Myocardial septal  temperature was monitored throughout the crossclamp.  We  turned our attention first to the diagonal coronary artery, which was opened and admitted 1.5 mm probe distally down 2 branches.  Using a running 7-0 Prolene, distal anastomosis was performed with  a segment of reverse saphenous vein graft.  Attention was  then turned to the intramyocardial LAD.  This vessel was a very thin-walled vessel.  It did admit a 1 mm probe distally and proximally.  Using a running 8-0 Prolene, the left internal mammary  artery was anastomosed to the left anterior descending coronary artery.  With the crossclamp still in place, a single punch aortotomy was performed and the vein graft to the diagonal was anastomosed to the ascending aorta.  The bulldog was removed from  the mammary artery with rise in myocardial septal temperature.  The heart was allowed to passively fill and deair and the proximal anastomosis was completed.  Aortic crossclamp was removed with a total crossclamp time of 61 minutes.  The patient had been  started on milrinone and dopamine.  With rewarming, he converted to a sinus rhythm.  Atrial and ventricular pacing wires were applied.  Sites of anastomosis were inspected.  The patient was then ventilated and weaned from cardiopulmonary bypass without  difficulty and remained hemodynamically stable.  He was decannulated in the usual fashion.  Protamine sulfate was administered.  With the operative field hemostatic, a graft marker was applied.  A left pleural tube and Blake mediastinal drain were left  in place.  Pericardium was loosely reapproximated.  Sternum was closed with #6 stainless steel wire.  Fascia closed with interrupted 0 Vicryl, running 3-0 Vicryl in subcutaneous tissue, 3-0 subcuticular stitch in skin edges.  Dry dressings were applied.   Sponge and needle count was reported as correct at completion of the procedure.  The patient tolerated the procedure without obvious complication and was transferred to the surgical intensive care unit for further postoperative care. Total pump time was  102 minutes.  The patient did not require any blood bank blood products during the operative procedure.  VN/NUANCE  D:09/21/2019 T:09/21/2019 JOB:[id number]

## 2019-09-21 NOTE — Progress Notes (Signed)
Anesthesiology Follow-up: 59 year old male 2 days S/P CABG X 3 done emergently for STEMI.   Balloon pump removed yesterday   Awake and alert, neuro intact, sitting in chair in good spirits.   VS: T-37.4 BP- 127/86 Hr- 88 (SR) RR- 13 O2 Sat 96% on RA  K- 4.3 BUN/Cr.- 21/0.99 glucose- 129 H/H- 10.6/32.4 platelets- 126,000   Stable post-op course, no apparent anesthetic complications.  [name]

## 2019-09-22 ENCOUNTER — Inpatient Hospital Stay (HOSPITAL_COMMUNITY): Payer: 59

## 2019-09-22 ENCOUNTER — Encounter (HOSPITAL_COMMUNITY): Payer: Self-pay | Admitting: Cardiothoracic Surgery

## 2019-09-22 LAB — BASIC METABOLIC PANEL
Anion gap: 6 (ref 5–15)
BUN: 21 mg/dL — ABNORMAL HIGH (ref 6–20)
CO2: 28 mmol/L (ref 22–32)
Calcium: 7.9 mg/dL — ABNORMAL LOW (ref 8.9–10.3)
Chloride: 103 mmol/L (ref 98–111)
Creatinine, Ser: 0.9 mg/dL (ref 0.61–1.24)
GFR calc Af Amer: >60 mL/min (ref >60)
GFR calc non Af Amer: >60 mL/min (ref >60)
Glucose, Bld: 110 mg/dL — ABNORMAL HIGH (ref 70–99)
Potassium: 3.8 mmol/L (ref 3.5–5.1)
Sodium: 137 mmol/L (ref 135–145)

## 2019-09-22 LAB — CBC
HCT: 29.8 % — ABNORMAL LOW (ref 39.0–52.0)
Hemoglobin: 9.8 g/dL — ABNORMAL LOW (ref 13.0–17.0)
MCH: 31 pg (ref 26.0–34.0)
MCHC: 32.9 g/dL (ref 30.0–36.0)
MCV: 94.3 fL (ref 80.0–100.0)
Platelets: 127 10*3/uL — ABNORMAL LOW (ref 150–400)
RBC: 3.16 MIL/uL — ABNORMAL LOW (ref 4.22–5.81)
RDW: 13.2 % (ref 11.5–15.5)
WBC: 10.3 10*3/uL (ref 4.0–10.5)
nRBC: 0 % (ref 0.0–0.2)

## 2019-09-22 LAB — GLUCOSE, CAPILLARY
Glucose-Capillary: 121 mg/dL — ABNORMAL HIGH (ref 70–99)
Glucose-Capillary: 103 mg/dL — ABNORMAL HIGH (ref 70–99)
Glucose-Capillary: 117 mg/dL — ABNORMAL HIGH (ref 70–99)
Glucose-Capillary: 91 mg/dL (ref 70–99)

## 2019-09-22 LAB — ECHO INTRAOPERATIVE TEE
Height: 71 in
Weight: 3648 oz

## 2019-09-22 MED ORDER — OXYCODONE HCL 5 MG PO TABS: 5.0000 mg | ORAL_TABLET | ORAL | Status: DC | PRN

## 2019-09-22 MED ORDER — ~~LOC~~ CARDIAC SURGERY, PATIENT & FAMILY EDUCATION: Freq: Once | Status: DC

## 2019-09-22 MED ORDER — DOCUSATE SODIUM 100 MG PO CAPS
200.0000 mg | ORAL_CAPSULE | Freq: Every day | ORAL | Status: DC
  Administered 2019-09-23: 200 mg via ORAL
  Filled 2019-09-22: qty 2

## 2019-09-22 MED ORDER — TRAMADOL HCL 50 MG PO TABS
50.0000 mg | ORAL_TABLET | ORAL | Status: DC | PRN
  Administered 2019-09-22 (×2): 100 mg via ORAL
  Filled 2019-09-22 (×2): qty 2

## 2019-09-22 MED ORDER — SODIUM CHLORIDE 0.9% FLUSH
3.0000 mL | Freq: Two times a day (BID) | INTRAVENOUS | Status: DC
  Administered 2019-09-23 – 2019-09-24 (×4): 3 mL via INTRAVENOUS

## 2019-09-22 MED ORDER — BISACODYL 10 MG RE SUPP: 10.0000 mg | Freq: Every day | RECTAL | Status: DC | PRN

## 2019-09-22 MED ORDER — POTASSIUM CHLORIDE CRYS ER 10 MEQ PO TBCR
10.0000 meq | EXTENDED_RELEASE_TABLET | Freq: Every day | ORAL | Status: AC
  Administered 2019-09-22 – 2019-09-24 (×3): 10 meq via ORAL
  Filled 2019-09-22 (×3): qty 1

## 2019-09-22 MED ORDER — GUAIFENESIN ER 600 MG PO TB12: 600.0000 mg | ORAL_TABLET | Freq: Two times a day (BID) | ORAL | Status: DC | PRN

## 2019-09-22 MED ORDER — ALUM & MAG HYDROXIDE-SIMETH 200-200-20 MG/5ML PO SUSP: 15.0000 mL | ORAL | Status: DC | PRN

## 2019-09-22 MED ORDER — MILRINONE LACTATE IN DEXTROSE 20-5 MG/100ML-% IV SOLN: 0.1250 ug/kg/min | INTRAVENOUS | Status: DC

## 2019-09-22 MED ORDER — FUROSEMIDE 40 MG PO TABS
40.0000 mg | ORAL_TABLET | Freq: Every day | ORAL | Status: AC
  Administered 2019-09-22 – 2019-09-24 (×3): 40 mg via ORAL
  Filled 2019-09-22 (×3): qty 1

## 2019-09-22 MED ORDER — INSULIN ASPART 100 UNIT/ML ~~LOC~~ SOLN: 0.0000 [IU] | Freq: Three times a day (TID) | SUBCUTANEOUS | Status: DC

## 2019-09-22 MED ORDER — BISACODYL 5 MG PO TBEC: 10.0000 mg | DELAYED_RELEASE_TABLET | Freq: Every day | ORAL | Status: DC | PRN

## 2019-09-22 MED ORDER — CLOPIDOGREL BISULFATE 75 MG PO TABS
75.0000 mg | ORAL_TABLET | Freq: Every day | ORAL | Status: DC
  Administered 2019-09-22 – 2019-09-24 (×3): 75 mg via ORAL
  Filled 2019-09-22 (×3): qty 1

## 2019-09-22 MED ORDER — ASPIRIN EC 81 MG PO TBEC
81.0000 mg | DELAYED_RELEASE_TABLET | Freq: Every day | ORAL | Status: DC
  Administered 2019-09-23 – 2019-09-24 (×2): 81 mg via ORAL
  Filled 2019-09-22 (×2): qty 1

## 2019-09-22 MED ORDER — SODIUM CHLORIDE 0.9 % IV SOLN: 250.0000 mL | INTRAVENOUS | Status: DC | PRN

## 2019-09-22 MED ORDER — ONDANSETRON HCL 4 MG PO TABS: 4.0000 mg | ORAL_TABLET | Freq: Four times a day (QID) | ORAL | Status: DC | PRN

## 2019-09-22 MED ORDER — SODIUM CHLORIDE 0.9% FLUSH: 3.0000 mL | INTRAVENOUS | Status: DC | PRN

## 2019-09-22 MED ORDER — ACETAMINOPHEN 325 MG PO TABS: 650.0000 mg | ORAL_TABLET | Freq: Four times a day (QID) | ORAL | Status: DC | PRN

## 2019-09-22 MED ORDER — ONDANSETRON HCL 4 MG/2ML IJ SOLN: 4.0000 mg | Freq: Four times a day (QID) | INTRAMUSCULAR | Status: DC | PRN

## 2019-09-22 MED ORDER — PANTOPRAZOLE SODIUM 40 MG PO TBEC
40.0000 mg | DELAYED_RELEASE_TABLET | Freq: Every day | ORAL | Status: DC
  Administered 2019-09-22 – 2019-09-24 (×3): 40 mg via ORAL
  Filled 2019-09-22 (×2): qty 1

## 2019-09-22 NOTE — Progress Notes (Signed)
Patient ID: [name], male   DOB: 1960-09-07, 59 y.o.   MRN: [id number] TCTS DAILY ICU PROGRESS NOTE                   [address]            [place] [address]          [phone]   3 Days Post-Op Procedure(s) (LRB): CORONARY ARTERY BYPASS GRAFTING (CABG) using LIMA to LAD; Endoscopic Right Greater Saphenous Vein Harvest: SVG to Diag. (N/A) TRANSESOPHAGEAL ECHOCARDIOGRAM (TEE) (N/A) Endovein Harvest Of Greater Saphenous Vein (Right)  Total Length of Stay:  LOS: 3 days   Subjective: Patient awake alert neurologically intact walking entirely around the unit this morning  Objective: Vital signs in last 24 hours: Temp:  [98.3 F (36.8 C)-98.9 F (37.2 C)] 98.3 F (36.8 C) (05/28 0635) Pulse Rate:  [88-107] 91 (05/28 0700) Cardiac Rhythm: Normal sinus rhythm (05/28 0400) Resp:  [10-28] 10 (05/28 0700) BP: (90-140)/(52-97) 107/68 (05/28 0700) SpO2:  [89 %-98 %] 91 % (05/28 0700) Weight:  [110.2 kg] 110.2 kg (05/28 0600)  Filed Weights   09/19/19 0640 09/21/19 0500 09/22/19 0600  Weight: 103.4 kg 111.2 kg 110.2 kg    Weight change: -1 kg   Hemodynamic parameters for last 24 hours:    Intake/Output from previous day: 05/27 0701 - 05/28 0700 In: 974.2 [P.O.:250; I.V.:671; IV Piggyback:53.2] Out: 2310 [Urine:2300; Chest Tube:10]  Intake/Output this shift: No intake/output data recorded.  Current Meds: Scheduled Meds: . acetaminophen  1,000 mg Oral Q6H   Or  . acetaminophen (TYLENOL) oral liquid 160 mg/5 mL  1,000 mg Per Tube Q6H  . aspirin EC  325 mg Oral Daily   Or  . aspirin  324 mg Per Tube Daily  . atorvastatin  80 mg Oral Daily  . bisacodyl  10 mg Oral Daily   Or  . bisacodyl  10 mg Rectal Daily  . carvedilol  6.25 mg Oral BID WC  . Chlorhexidine Gluconate Cloth  6 each Topical q morning - 10a  . docusate sodium  200 mg Oral Daily  . enoxaparin (LOVENOX) injection  40 mg Subcutaneous Q24H  . insulin aspart  0-24 Units Subcutaneous TID  AC & HS  . lisinopril  2.5 mg Oral Daily  . mouth rinse  15 mL Mouth Rinse BID  . pantoprazole  40 mg Oral Daily  . sodium chloride flush  10-40 mL Intracatheter Q12H  . sodium chloride flush  3 mL Intravenous Once  . sodium chloride flush  3 mL Intravenous Q12H  . sodium chloride flush  3 mL Intravenous Q12H   Continuous Infusions: . sodium chloride Stopped (09/20/19 1001)  . sodium chloride 20 mL/hr at 09/20/19 0400  . sodium chloride    . sodium chloride    . sodium chloride 10 mL/hr at 09/20/19 0400  . dexmedetomidine (PRECEDEX) IV infusion Stopped (09/19/19 1706)  . insulin Stopped (09/20/19 1344)  . lactated ringers    . lactated ringers    . lactated ringers 20 mL/hr at 09/22/19 0700  . milrinone 0.2 mcg/kg/min (09/22/19 0700)  . nitroGLYCERIN Stopped (09/19/19 1657)   PRN Meds:.sodium chloride, sodium chloride, acetaminophen, dextrose, fentaNYL (SUBLIMAZE) injection, lactated ringers, metoprolol tartrate, midazolam, nitroGLYCERIN, ondansetron (ZOFRAN) IV, oxyCODONE, sodium chloride flush, sodium chloride flush, sodium chloride flush, traMADol  General appearance: alert, cooperative and no distress Neurologic: intact Heart: regular rate and rhythm, S1, S2 normal, no murmur, click, rub or  gallop Lungs: diminished breath sounds bibasilar Abdomen: soft, non-tender; bowel sounds normal; no masses,  no organomegaly Extremities: extremities normal, atraumatic, no cyanosis or edema and Homans sign is negative, no sign of DVT Wound: Sternum stable  Lab Results: CBC: Recent Labs    09/21/19 0500 09/22/19 0417  WBC 13.9* 10.3  HGB 10.6* 9.8*  HCT 32.4* 29.8*  PLT 126* 127*   BMET:  Recent Labs    09/21/19 0500 09/22/19 0417  NA 136 137  K 4.3 3.8  CL 106 103  CO2 21* 28  GLUCOSE 129* 110*  BUN 21* 21*  CREATININE 0.99 0.90  CALCIUM 7.6* 7.9*    CMET: Lab Results  Component Value Date   WBC 10.3 09/22/2019   HGB 9.8 (L) 09/22/2019   HCT 29.8 (L) 09/22/2019     PLT 127 (L) 09/22/2019   GLUCOSE 110 (H) 09/22/2019   CHOL 203 (H) 09/19/2019   TRIG 146 09/19/2019   HDL 43 09/19/2019   LDLCALC 131 (H) 09/19/2019   NA 137 09/22/2019   K 3.8 09/22/2019   CL 103 09/22/2019   CREATININE 0.90 09/22/2019   BUN 21 (H) 09/22/2019   CO2 28 09/22/2019   INR 1.4 (H) 09/19/2019   HGBA1C 6.3 (H) 09/19/2019      PT/INR:  Recent Labs    09/19/19 1532  LABPROT 17.0*  INR 1.4*   Radiology: No results found.   Assessment/Plan: S/P Procedure(s) (LRB): CORONARY ARTERY BYPASS GRAFTING (CABG) using LIMA to LAD; Endoscopic Right Greater Saphenous Vein Harvest: SVG to Diag. (N/A) TRANSESOPHAGEAL ECHOCARDIOGRAM (TEE) (N/A) Endovein Harvest Of Greater Saphenous Vein (Right) Mobilize Diuresis Diabetes control Plan for transfer to step-down: see transfer orders DC central line Wean off milrinone   Grace Isaac 09/22/2019 7:49 AM

## 2019-09-22 NOTE — Discharge Summary (Signed)
Physician Discharge Summary  Patient ID: [name] MRN: [id number] DOB/AGE: [birth year] 59 y.o.  Admit date: 09/19/2019 Discharge date: 09/24/2019  Admission Diagnoses:  Discharge Diagnoses:  Active Problems:   Acute MI, anterior wall (HCC)   ST elevation myocardial infarction (STEMI) (HCC)   S/P CABG x 2   Discharged Condition: good  HPI:   Dr. Roxy Manns was called to the Cath Lab for emergency cardiothoracic surgical consultation.  Patient is a 59 year old male with no previous history of coronary artery disease who describes a several month week history of symptoms of "indigestion" and develop sudden onset severe substernal chest pressure associated with shortness of breath at rest this morning.  Symptoms persisted ultimately prompting him to present to the emergency department where baseline EKG revealed sinus rhythm with ST segment elevation in leads V1 through V3.  Patient was taken directly to the cardiac Cath Lab by Dr. Tamala Julian.  Chest pain had resolved prior to commencement of diagnostic catheterization.  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  Initially there was TIMI II flow with sluggish flow in the large diagonal branch.  Following heparinization and passing guidewire down the distal left anterior descending coronary artery flow appeared to improve and the patient remained pain-free.  There was insignificant disease in the left circumflex and right coronary artery territories.  Ejection fraction was estimated 40% with anterior wall akinesis.  Left ventricular end-diastolic pressure is 16 mmHg.  I agree that patient would likely best be treated with surgical revascularization as coronary anatomy is appears unfavorable for definitive PCI and stenting.  I have briefly counseled the patient while he remains on the Cath Lab table  regarding the indications, risk, and potential benefits of emergency coronary artery bypass grafting.  I favor placement of intra-aortic balloon pump to facilitate safe transition to the operating room for emergency surgical revascularization.   Hospital Course:   [name] is a 59 year old male patient who underwent emergent CT coronary artery bypass grafting x2 with Dr. Servando Snare on 09/19/2019.  He tolerated the procedure well and was transferred to the surgical ICU for continued care.  He was extubated timely manner.  Postop day 1 he was alert and neurologically intact.  He did have an intra-aortic balloon pump which was now placed on 1:3.  We were able to remove mediastinal chest tubes.  He did have some expected acute blood loss anemia.  His balloon pump was removed.  Postop day 2 we will begin mobilize patient.  We discontinued the remaining chest tubes and lines.  We were weaning him off of dopamine.  We added a low-dose ACE inhibitor and he remained on Coreg and a statin.  Postop day 3 he continued to progress.  We are working on weaning milrinone.  We discontinued his central line.  He was stable to transfer to the telemetry floor for continued care. He progressed on the floor. He was ambulated around the unit without issue. Today, he is ready for discharge.   Consults: None  Significant Diagnostic Studies:   Anterior ST elevation myocardial infarction  99% trifurcating proximal LAD, diagonal, septal perforator with TIMI grade I-II flow initially improving to TIMI grade III after a wire in the anterior descending, IV heparin, and intracoronary nitroglycerin.  Became totally pain-free.  Left main is widely patent  Circumflex is widely patent  RCA with eccentric distal 40 to  50% narrowing  Mid anterior wall akinesis.  EF 45%.  LVEDP 16.  Insertion of intra-aortic balloon pump  RECOMMENDATIONS:   After consideration, revascularization with urgent coronary surgery due to  anatomic subset that is not favorable for PCI is recommended..  Intra-aortic balloon pump to improve stability  IV heparin keeping ACT greater than 200.  Treatments:  NAME: SYE, SCHROEPFER MEDICAL RECORD [id number] ACCOUNT [id number] DATE OF BIRTH:[birth year] FACILITY: [facility] LOCATION: [facility] PHYSICIAN:EDWARD BTyrone Sage, MD  OPERATIVE REPORT  DATE OF PROCEDURE:  09/19/2019  PREOPERATIVE DIAGNOSIS:  Presentation with ST segment elevation myocardial infarction involving left anterior descending and diagonal.  POSTOPERATIVE DIAGNOSIS:  Presentation with ST segment elevation myocardial infarction involving left anterior descending and diagonal.  SURGICAL PROCEDURE:   1.  Emergency coronary artery bypass grafting x2 with the left internal mammary to the left anterior descending coronary artery and reverse saphenous vein graft to the diagonal coronary artery. 2.  Right thigh Endovein harvesting of the greater saphenous vein.  SURGEON:  Sheliah Plane, MD  FIRST ASSISTANT:  Jari Favre, [place].   Discharge Exam: Blood pressure 135/67, pulse 97, temperature 98.4 F (36.9 C), temperature source Oral, resp. rate 18, height 5\' 11"  (1.803 m), weight 107.7 kg, SpO2 95 %.   General appearance: alert, cooperative and no distress Heart: regular rate and rhythm, S1, S2 normal, no murmur, click, rub or gallop Lungs: clear to auscultation bilaterally Abdomen: soft, non-tender; bowel sounds normal; no masses,  no organomegaly Extremities: extremities normal, atraumatic, no cyanosis or edema Wound: clean and dry  Disposition: Discharge disposition: 01-Home or Self Care       Discharge Instructions    Amb Referral to Cardiac Rehabilitation   Complete by: As directed    Diagnosis: CABG   CABG X ___: 2   After initial evaluation and assessments completed: Virtual Based Care may be provided alone or in conjunction with Phase 2 Cardiac Rehab based on patient barriers.: Yes      Allergies as of 09/24/2019      Reactions   Morphine And Related Nausea And Vomiting      Medication List    STOP taking these medications   ibuprofen 200 MG tablet Commonly known as: ADVIL     TAKE these medications   acetaminophen 325 MG tablet Commonly known as: TYLENOL Take 2 tablets (650 mg total) by mouth every 6 (six) hours as needed for mild pain.   aspirin 81 MG EC tablet Take 1 tablet (81 mg total) by mouth daily.   atorvastatin 80 MG tablet Commonly known as: LIPITOR Take 1 tablet (80 mg total) by mouth daily.   calcium carbonate 500 MG chewable tablet Commonly known as: TUMS - dosed in mg elemental calcium Chew 1 tablet by mouth daily as needed for indigestion or heartburn.   carvedilol 6.25 MG tablet Commonly known as: COREG Take 1 tablet (6.25 mg total) by mouth 2 (two) times daily with a meal.   clopidogrel 75 MG tablet Commonly known as: PLAVIX Take 1 tablet (75 mg total) by mouth daily.   furosemide 40 MG tablet Commonly known as: LASIX Take 1 tablet (40 mg total) by mouth daily.   lisinopril 2.5 MG tablet Commonly known as: ZESTRIL Take 1 tablet (2.5 mg total) by mouth daily.   oxyCODONE 5 MG immediate release tablet Commonly known as: Oxy IR/ROXICODONE Take 1 tablet (5 mg total) by mouth every 6 (six) hours as needed for severe pain.   potassium chloride 10 MEQ tablet Commonly  known as: KLOR-CON Take 1 tablet (10 mEq total) by mouth daily.      Follow-up Information    Delight Ovens, MD Follow up.   Specialty: Cardiothoracic Surgery Why: Your routine follow-up appointment is on 6/24 at 9:30am. Please arrive at 9:00am for a chest xray located at [facility] which is onm the first floor of our building.  Contact information: [address] [place] [address] [phone]        Lyn Records, MD Follow up.   Specialty: Cardiology Why: PLease call the office for an appointment within 2 weeks Contact  information: [address] [place] [address] [phone]          The patient has been discharged on:   1.Beta Blocker:  Yes [ yes  ]                              No   [   ]                              If No, reason:  2.Ace Inhibitor/ARB: Yes [ yes  ]                                     No  [    ]                                     If No, reason:  3.Statin:   Yes [ yes  ]                  No  [   ]                  If No, reason:  4.Ecasa:  Yes  [  yes ]                  No   [   ]                  If No, reason:     Signed: [name] 09/24/2019, 8:14 AM

## 2019-09-22 NOTE — Progress Notes (Signed)
Pt arrived to unit. VSS. Pt oriented to unit . Will continue to monitor  Everlean Cherry, RN

## 2019-09-22 NOTE — Discharge Instructions (Signed)

## 2019-09-22 NOTE — Progress Notes (Signed)
14:52 - 15:10  Attempted to ambulate with patient but he refused. Recent transfer and wants to rest. Encouraged to walk later with RN. Pt was an emergent  And no benefit of pre-op education. Provided [facility] booklet, and reviewed " Move in the tube" and provided handout. Discussed bed mobility. Stressed use of I/S - 10x/hr. Pt did 750 mL.   Lorin Picket MS, ACSM-EP-C, CCRP

## 2019-09-23 ENCOUNTER — Inpatient Hospital Stay (HOSPITAL_COMMUNITY): Payer: 59

## 2019-09-23 LAB — CBC
HCT: 32.7 % — ABNORMAL LOW (ref 39.0–52.0)
Hemoglobin: 10.6 g/dL — ABNORMAL LOW (ref 13.0–17.0)
MCH: 30.5 pg (ref 26.0–34.0)
MCHC: 32.4 g/dL (ref 30.0–36.0)
MCV: 94.2 fL (ref 80.0–100.0)
Platelets: 187 10*3/uL (ref 150–400)
RBC: 3.47 MIL/uL — ABNORMAL LOW (ref 4.22–5.81)
RDW: 13.2 % (ref 11.5–15.5)
WBC: 7.9 10*3/uL (ref 4.0–10.5)
nRBC: 0 % (ref 0.0–0.2)

## 2019-09-23 LAB — BASIC METABOLIC PANEL
Anion gap: 7 (ref 5–15)
BUN: 20 mg/dL (ref 6–20)
CO2: 29 mmol/L (ref 22–32)
Calcium: 8 mg/dL — ABNORMAL LOW (ref 8.9–10.3)
Chloride: 101 mmol/L (ref 98–111)
Creatinine, Ser: 0.94 mg/dL (ref 0.61–1.24)
GFR calc Af Amer: >60 mL/min (ref >60)
GFR calc non Af Amer: >60 mL/min (ref >60)
Glucose, Bld: 101 mg/dL — ABNORMAL HIGH (ref 70–99)
Potassium: 4.4 mmol/L (ref 3.5–5.1)
Sodium: 137 mmol/L (ref 135–145)

## 2019-09-23 LAB — GLUCOSE, CAPILLARY: Glucose-Capillary: 93 mg/dL (ref 70–99)

## 2019-09-23 NOTE — Plan of Care (Signed)
  Problem: Health Behavior/Discharge Planning: Goal: Ability to manage health-related needs will improve Outcome: Progressing   Problem: Activity: Goal: Risk for activity intolerance will decrease Outcome: Progressing   

## 2019-09-23 NOTE — Progress Notes (Addendum)
      [address]       [name] [address]             [phone]      4 Days Post-Op Procedure(s) (LRB): CORONARY ARTERY BYPASS GRAFTING (CABG) using LIMA to LAD; Endoscopic Right Greater Saphenous Vein Harvest: SVG to Diag. (N/A) TRANSESOPHAGEAL ECHOCARDIOGRAM (TEE) (N/A) Endovein Harvest Of Greater Saphenous Vein (Right) Subjective: Feels okay this morning. No complaints  Objective: Vital signs in last 24 hours: Temp:  [97.7 F (36.5 C)-98.7 F (37.1 C)] 98.1 F (36.7 C) (05/29 0848) Pulse Rate:  [81-94] 94 (05/29 0848) Cardiac Rhythm: Normal sinus rhythm (05/29 0825) Resp:  [13-18] 18 (05/29 0848) BP: (103-136)/(61-78) 132/77 (05/29 0848) SpO2:  [86 %-97 %] 97 % (05/29 0345) Weight:  [109.9 kg] 109.9 kg (05/29 0345)     Intake/Output from previous day: 05/28 0701 - 05/29 0700 In: 260.9 [P.O.:200; I.V.:60.9] Out: 1850 [Urine:1850] Intake/Output this shift: No intake/output data recorded.  General appearance: alert, cooperative and no distress Heart: regular rate and rhythm, S1, S2 normal, no murmur, click, rub or gallop Lungs: clear to auscultation bilaterally Abdomen: soft, non-tender; bowel sounds normal; no masses,  no organomegaly Extremities: extremities normal, atraumatic, no cyanosis or edema Wound: clean and dry  Lab Results: Recent Labs    09/22/19 0417 09/23/19 0343  WBC 10.3 7.9  HGB 9.8* 10.6*  HCT 29.8* 32.7*  PLT 127* 187   BMET:  Recent Labs    09/22/19 0417 09/23/19 0343  NA 137 137  K 3.8 4.4  CL 103 101  CO2 28 29  GLUCOSE 110* 101*  BUN 21* 20  CREATININE 0.90 0.94  CALCIUM 7.9* 8.0*    PT/INR: No results for input(s): LABPROT, INR in the last 72 hours. ABG    Component Value Date/Time   PHART 7.337 (L) 09/19/2019 1948   HCO3 21.5 09/19/2019 1948   TCO2 23 09/19/2019 1948   ACIDBASEDEF 4.0 (H) 09/19/2019 1948   O2SAT 64.8 09/21/2019 0633   CBG (last 3)  Recent Labs    09/22/19 1723 09/22/19 2059  09/23/19 0610  GLUCAP 117* 91 93    Assessment/Plan: S/P Procedure(s) (LRB): CORONARY ARTERY BYPASS GRAFTING (CABG) using LIMA to LAD; Endoscopic Right Greater Saphenous Vein Harvest: SVG to Diag. (N/A) TRANSESOPHAGEAL ECHOCARDIOGRAM (TEE) (N/A) Endovein Harvest Of Greater Saphenous Vein (Right)  1. CV-NSR in the 90s, BP well controlled.  2. Pulm- Tolerating room air with good oxygen saturation. CXR shows small right hydropneumo with tiny decrease in right apical pneumothorax component. Stable small left pleural effusion. Low lung volumes with mild bibasilar and left upper atelectasis.  3. Renal-creatinine 0.94, electrolytes okay 4. H and H 10.6/32.7, expected acute blood loss anemia 5. Endo-blood glucose well controlled.   Plan: discontinue EPW since rhythm has been stable. Feels good this morning. Encouraged walking in the halls and incentive spirometer. Probably can go home tomorrow.    LOS: 4 days    [name] 09/23/2019  Doing well Pain controlled Pacemaker wires Dispo planning likely home tomorrow [name]

## 2019-09-23 NOTE — Progress Notes (Signed)
EPW's removed per MD order without difficulty.  Will continue to monitor. 

## 2019-09-23 NOTE — Progress Notes (Signed)
CARDIAC REHAB PHASE I   PRE:  Rate/Rhythm: 96 NSR    BP: sitting 126/82    SaO2: 93 RA  MODE:  Ambulation: 240 ft   POST:  Rate/Rhythm: ST 118 with ambulation, 97 post walk    BP: sitting 151/90     SaO2: 92 RA 1050-1150 Patient sitting up in chair upon arrival to room. Wife at bedside. [facility] discharge education completed including review of [facility] booklet, activity progression, when to call MD, diet, and wound care. Patient able to return demo use of IS and Cough/deep breath utilizing heart pillow. Ambulated in hallway with slow steady gait x 1 assist. C/O sternal chest discomfort. Denies symptoms of dizziness. Post ambulation patient assisted back to bed with call bell in reach. Encouraged to watch [facility] recovery video. Phase 2 CR discussed and with patient permission, referral placed to [place]. Patient and wife appreciative of education.    Avyn Aden Hoover Brunette RN, BSN

## 2019-09-24 MED ORDER — FUROSEMIDE 40 MG PO TABS: 40.0000 mg | ORAL_TABLET | Freq: Every day | ORAL | 0 refills | Status: DC

## 2019-09-24 MED ORDER — CARVEDILOL 6.25 MG PO TABS: 6.2500 mg | ORAL_TABLET | Freq: Two times a day (BID) | ORAL | 1 refills | Status: DC

## 2019-09-24 MED ORDER — ASPIRIN 81 MG PO TBEC: 81.0000 mg | DELAYED_RELEASE_TABLET | Freq: Every day | ORAL | Status: AC

## 2019-09-24 MED ORDER — POTASSIUM CHLORIDE CRYS ER 10 MEQ PO TBCR: 10.0000 meq | EXTENDED_RELEASE_TABLET | Freq: Every day | ORAL | 0 refills | Status: DC

## 2019-09-24 MED ORDER — ACETAMINOPHEN 325 MG PO TABS: 650.0000 mg | ORAL_TABLET | Freq: Four times a day (QID) | ORAL | Status: AC | PRN

## 2019-09-24 MED ORDER — ATORVASTATIN CALCIUM 80 MG PO TABS: 80.0000 mg | ORAL_TABLET | Freq: Every day | ORAL | 1 refills | Status: DC

## 2019-09-24 MED ORDER — OXYCODONE HCL 5 MG PO TABS: 5.0000 mg | ORAL_TABLET | Freq: Four times a day (QID) | ORAL | 0 refills | Status: DC | PRN

## 2019-09-24 MED ORDER — LISINOPRIL 2.5 MG PO TABS: 2.5000 mg | ORAL_TABLET | Freq: Every day | ORAL | 1 refills | Status: DC

## 2019-09-24 MED ORDER — CLOPIDOGREL BISULFATE 75 MG PO TABS: 75.0000 mg | ORAL_TABLET | Freq: Every day | ORAL | 1 refills | Status: DC

## 2019-09-24 NOTE — Progress Notes (Signed)
Order received to discharge patient.  Telemetry monitor removed and CCMD notified.  PIV access x2 removed.  Discharge instructions, follow up, medications and instructions for their use discussed with patient. 

## 2019-09-24 NOTE — Plan of Care (Signed)
Continue to monitor

## 2019-09-24 NOTE — Progress Notes (Signed)
Pt ambulated x 470 feet independently pt tolerated well.  

## 2019-09-24 NOTE — Progress Notes (Signed)
      [address]       [name] [address]             [phone]      5 Days Post-Op Procedure(s) (LRB): CORONARY ARTERY BYPASS GRAFTING (CABG) using LIMA to LAD; Endoscopic Right Greater Saphenous Vein Harvest: SVG to Diag. (N/A) TRANSESOPHAGEAL ECHOCARDIOGRAM (TEE) (N/A) Endovein Harvest Of Greater Saphenous Vein (Right) Subjective: No issues this morning.   Objective: Vital signs in last 24 hours: Temp:  [98.1 F (36.7 C)-99.1 F (37.3 C)] 98.3 F (36.8 C) (05/30 0549) Pulse Rate:  [82-98] 82 (05/30 0549) Cardiac Rhythm: Normal sinus rhythm (05/30 0549) Resp:  [12-20] 12 (05/30 0549) BP: (102-151)/(60-90) 129/80 (05/30 0549) SpO2:  [92 %-98 %] 98 % (05/30 0549) Weight:  [107.7 kg] 107.7 kg (05/30 0549)     Intake/Output from previous day: 05/29 0701 - 05/30 0700 In: 480 [P.O.:480] Out: 1600 [Urine:1600] Intake/Output this shift: No intake/output data recorded.  General appearance: alert, cooperative and no distress Heart: regular rate and rhythm, S1, S2 normal, no murmur, click, rub or gallop Lungs: clear to auscultation bilaterally Abdomen: soft, non-tender; bowel sounds normal; no masses,  no organomegaly Extremities: extremities normal, atraumatic, no cyanosis or edema Wound: clean and dry  Lab Results: Recent Labs    09/22/19 0417 09/23/19 0343  WBC 10.3 7.9  HGB 9.8* 10.6*  HCT 29.8* 32.7*  PLT 127* 187   BMET:  Recent Labs    09/22/19 0417 09/23/19 0343  NA 137 137  K 3.8 4.4  CL 103 101  CO2 28 29  GLUCOSE 110* 101*  BUN 21* 20  CREATININE 0.90 0.94  CALCIUM 7.9* 8.0*    PT/INR: No results for input(s): LABPROT, INR in the last 72 hours. ABG    Component Value Date/Time   PHART 7.337 (L) 09/19/2019 1948   HCO3 21.5 09/19/2019 1948   TCO2 23 09/19/2019 1948   ACIDBASEDEF 4.0 (H) 09/19/2019 1948   O2SAT 64.8 09/21/2019 0633   CBG (last 3)  Recent Labs    09/22/19 1723 09/22/19 2059 09/23/19 0610  GLUCAP  117* 91 93    Assessment/Plan: S/P Procedure(s) (LRB): CORONARY ARTERY BYPASS GRAFTING (CABG) using LIMA to LAD; Endoscopic Right Greater Saphenous Vein Harvest: SVG to Diag. (N/A) TRANSESOPHAGEAL ECHOCARDIOGRAM (TEE) (N/A) Endovein Harvest Of Greater Saphenous Vein (Right)  1. CV-NSR in the 90s, BP well controlled. Continue asa, statin, and coreg 2. Pulm- Tolerating room air with good oxygen saturation. CXR shows small right hydropneumo with tiny decrease in right apical pneumothorax component. Stable small left pleural effusion. Low lung volumes with mild bibasilar and left upper atelectasis.  3. Renal-creatinine 0.94, electrolytes okay 4. H and H 10.6/32.7, expected acute blood loss anemia 5. Endo-blood glucose well controlled.   Plan: EPW removed without issue. Ready for home today.    LOS: 5 days    [name] 09/24/2019

## 2019-10-09 NOTE — Progress Notes (Signed)
Cardiology Office Note    Date:  10/11/2019   ID:  [name], DOB Apr 12, 1961, MRN [id number]  PCP:  Patient, No Pcp Per  Cardiologist: Lesleigh Noe, MD EPS: None  Chief Complaint  Patient presents with  . Follow-up    History of Present Illness:  [name] is a 59 y.o. male with no significant past medical history admitted with anterior STEMI and was taken to the Cath Lab and found to have a tight LAD.  He underwent emergency CABG times 2-5/25/21.  He also had elevated blood pressures elevated glucose and hyperlipidemia with an LDL of 131.  Patient comes in for f/u. Feels great. Walking 2 miles/day.No cardiac complaints. Complains of numbness and tingling in right hand, feeling cold. Started 4 days after MI. May be getting a little better.    No past medical history on file.  Past Surgical History:  Procedure Laterality Date  . CORONARY ARTERY BYPASS GRAFT N/A 09/19/2019   Procedure: CORONARY ARTERY BYPASS GRAFTING (CABG) using LIMA to LAD; Endoscopic Right Greater Saphenous Vein Harvest: SVG to Diag.;  Surgeon: Delight Ovens, MD;  Location[facility];  Service: Open Heart Surgery;  Laterality: N/A;  . CORONARY BALLOON ANGIOPLASTY N/A 09/19/2019   Procedure: CORONARY BALLOON ANGIOPLASTY;  Surgeon: Lyn Records, MD;  Location: [facility] INVASIVE CV LAB;  Service: Cardiovascular;  Laterality: N/A;  . ENDOVEIN HARVEST OF GREATER SAPHENOUS VEIN Right 09/19/2019   Procedure: Mack Guise Of Greater Saphenous Vein;  Surgeon: Delight Ovens, MD;  Location: [facility] OR;  Service: Open Heart Surgery;  Laterality: Right;  . IABP INSERTION N/A 09/19/2019   Procedure: IABP Insertion;  Surgeon: Lyn Records, MD;  Location: [facility] INVASIVE CV LAB;  Service: Cardiovascular;  Laterality: N/A;  . LEFT HEART CATH AND CORONARY ANGIOGRAPHY N/A 09/19/2019   Procedure: LEFT HEART CATH AND CORONARY ANGIOGRAPHY;  Surgeon: Lyn Records, MD;  Location: [facility] INVASIVE CV LAB;  Service: Cardiovascular;   Laterality: N/A;  . TEE WITHOUT CARDIOVERSION N/A 09/19/2019   Procedure: TRANSESOPHAGEAL ECHOCARDIOGRAM (TEE);  Surgeon: Delight Ovens, MD;  Location: [facility] OR;  Service: Open Heart Surgery;  Laterality: N/A;    Current Medications: Current Meds  Medication Sig  . acetaminophen (TYLENOL) 325 MG tablet Take 2 tablets (650 mg total) by mouth every 6 (six) hours as needed for mild pain.  Marland Kitchen aspirin EC 81 MG EC tablet Take 1 tablet (81 mg total) by mouth daily.  Marland Kitchen atorvastatin (LIPITOR) 80 MG tablet Take 1 tablet (80 mg total) by mouth daily.  . calcium carbonate (TUMS - DOSED IN MG ELEMENTAL CALCIUM) 500 MG chewable tablet Chew 1 tablet by mouth daily as needed for indigestion or heartburn.  . carvedilol (COREG) 12.5 MG tablet Take 1 tablet (12.5 mg total) by mouth 2 (two) times daily with a meal.  . clopidogrel (PLAVIX) 75 MG tablet Take 1 tablet (75 mg total) by mouth daily.  Marland Kitchen lisinopril (ZESTRIL) 2.5 MG tablet Take 1 tablet (2.5 mg total) by mouth daily.  . [DISCONTINUED] carvedilol (COREG) 6.25 MG tablet Take 1 tablet (6.25 mg total) by mouth 2 (two) times daily with a meal.     Allergies:   Morphine and related   Social History   Socioeconomic History  . Marital status: Married    Spouse name: Not on file  . Number of children: Not on file  . Years of education: Not on file  . Highest education level: Not on file  Occupational History  . Not on file  Tobacco Use  . Smoking status: Former Smoker    Types: Cigarettes    Quit date: 2001    Years since quitting: 20.4  . Smokeless tobacco: Never Used  Vaping Use  . Vaping Use: Never used  Substance and Sexual Activity  . Alcohol use: Not on file  . Drug use: Not on file  . Sexual activity: Not on file  Other Topics Concern  . Not on file  Social History Narrative  . Not on file   Social Determinants of Health   Financial Resource Strain:   . Difficulty of Paying Living Expenses:   Food Insecurity:   . Worried About  Programme researcher, broadcasting/film/video in the Last Year:   . Barista in the Last Year:   Transportation Needs:   . Freight forwarder (Medical):   Marland Kitchen Lack of Transportation (Non-Medical):   Physical Activity:   . Days of Exercise per Week:   . Minutes of Exercise per Session:   Stress:   . Feeling of Stress :   Social Connections:   . Frequency of Communication with Friends and Family:   . Frequency of Social Gatherings with Friends and Family:   . Attends Religious Services:   . Active Member of Clubs or Organizations:   . Attends Banker Meetings:   Marland Kitchen Marital Status:      Family History:  The patient's family history is not on file.   ROS:   Please see the history of present illness.    ROS All other systems reviewed and are negative.   PHYSICAL EXAM:   VS:  BP (!) 146/82   Pulse 83   Ht 5\' 11"  (1.803 m)   Wt 211 lb 1.9 oz (95.8 kg)   SpO2 98%   BMI 29.45 kg/m   Physical Exam  GEN: Well nourished, well developed, in no acute distress  Neck: no JVD, carotid bruits, or masses Cardiac:RRR; no murmurs, rubs, or gallops  Respiratory:  clear to auscultation bilaterally, normal work of breathing GI: soft, nontender, nondistended, + BS hand cold and pale-can't palpate radial pulse but good doppler reading. Good brachial pulse.lower extremities without cyanosis, clubbing, or edema, Good distal pulses bilaterally Neuro:  Alert and Oriented x 3 Psych: euthymic mood, full affect  Wt Readings from Last 3 Encounters:  10/11/19 211 lb 1.9 oz (95.8 kg)  09/24/19 237 lb 6.4 oz (107.7 kg)      Studies/Labs Reviewed:   EKG:  EKG is ordered today.    Recent Labs: 09/20/2019: Magnesium 2.5 09/23/2019: BUN 20; Creatinine, Ser 0.94; Hemoglobin 10.6; Platelets 187; Potassium 4.4; Sodium 137   Lipid Panel    Component Value Date/Time   CHOL 203 (H) 09/19/2019 0644   TRIG 146 09/19/2019 0644   HDL 43 09/19/2019 0644   CHOLHDL 4.7 09/19/2019 0644   VLDL 29  09/19/2019 0644   LDLCALC 131 (H) 09/19/2019 0644    Additional studies/ records that were reviewed today include:   Anterior ST elevation myocardial infarction  99% trifurcating proximal LAD, diagonal, septal perforator with TIMI grade I-II flow initially improving to TIMI grade III after a wire in the anterior descending, IV heparin, and intracoronary nitroglycerin.  Became totally pain-free.  Left main is widely patent  Circumflex is widely patent  RCA with eccentric distal 40 to 50% narrowing  Mid anterior wall akinesis.  EF 45%.  LVEDP 16.  Insertion of intra-aortic  balloon pump   RECOMMENDATIONS:    After consideration, revascularization with urgent coronary surgery due to anatomic subset that is not favorable for PCI is recommended..  Intra-aortic balloon pump to improve stability  IV heparin keeping ACT greater than 200.   Treatments:   NAME: ADOLF, ORMISTON MEDICAL RECORD [id number] ACCOUNT [id number] DATE OF BIRTH:31-Jul-1960 FACILITY: [facility] LOCATION: [facility] PHYSICIAN:EDWARD BServando Snare, MD   OPERATIVE REPORT   DATE OF PROCEDURE:  09/19/2019   PREOPERATIVE DIAGNOSIS:  Presentation with ST segment elevation myocardial infarction involving left anterior descending and diagonal.   POSTOPERATIVE DIAGNOSIS:  Presentation with ST segment elevation myocardial infarction involving left anterior descending and diagonal.   SURGICAL PROCEDURE:   1.  Emergency coronary artery bypass grafting x2 with the left internal mammary to the left anterior descending coronary artery and reverse saphenous vein graft to the diagonal coronary artery. 2.  Right thigh Endovein harvesting of the greater saphenous vein.   SURGEON:  Lanelle Bal, MD   FIRST ASSISTANT:  Nicholes Rough, PA.       ASSESSMENT:    1. Coronary artery disease involving coronary bypass graft of native heart without angina pectoris   2. Decreased radial pulse   3. Ischemic cardiomyopathy   4. Essential  hypertension   5. Hyperlipidemia, unspecified hyperlipidemia type   6. Elevated glucose      PLAN:  In order of problems listed above:  CAD status post anterior STEMI followed by emergency CT coronary artery bypass grafting times 2-5/25/21-on Plavix and aspirin Coreg and lisinopril  Right hand cold with numbness and tingling and  white.  Started 4 days after cath via right radial artery.  I could not palpate radial pulse but good pulse by Doppler.  Good brachial pulse.  Will get Dopplers today.-Arterial dopplers done and he has 2 radial arteries, the larger one which has thrombosed. I spoke with Dr. Trula Slade who will get him into their office to be seen in the next 2 days. This has been going on since shortly after surgery. Patient advised to go to ER for acute pain, loss of sensation in his right hand  Ischemic cardiomyopathy ejection fraction 45% no heart failure on exam titrate Coreg up to 12.5 mg twice daily  Hypertension new blood pressure slightly elevated titrating Coreg up  Elevated blood sugars in hospital but they came down.  Hyperlipidemia LDL 131 09/19/2019 recheck fasting lipid panel and LFTs in 3 months  Medication Adjustments/Labs and Tests Ordered: Current medicines are reviewed at length with the patient today.  Concerns regarding medicines are outlined above.  Medication changes, Labs and Tests ordered today are listed in the Patient Instructions below. Patient Instructions  Medication Instructions:  Your physician has recommended you make the following change in your medication:   INCREASE: carvedilol (coreg) to 12.5 mg by mouth twice a day  *If you need a refill on your cardiac medications before your next appointment, please call your pharmacy*   Lab Work: Your physician recommends that you return for a FASTING lipid profile and liver function test on 01/10/20  If you have labs (blood work) drawn today and your tests are completely normal, you will receive your  results only by: Marland Kitchen MyChart Message (if you have MyChart) OR . A paper copy in the mail If you have any lab test that is abnormal or we need to change your treatment, we will call you to review the results.   Testing/Procedures: Your physician has requested that you have an upper  extremity arterial duplex, TODAY at 1:30 PM. Please arrive at 1:15 PM This test is an ultrasound of the arteries in the arms. It looks at arterial blood flow in the arms. Allow one hour for Upper Arterial scans. There are no restrictions or special instructions    Follow-Up: Follow up with Dr. Katrinka Blazing on 12/13/19 at 1:20 PM   Other Instructions None     Signed, Jacolyn Reedy, PA-C  10/11/2019 2:03 PM    [facility] [address] [place], [place], [place]  [address] Phone: [phone]; Fax: [phone]

## 2019-10-11 ENCOUNTER — Encounter: Payer: Self-pay | Admitting: Physician Assistant

## 2019-10-11 ENCOUNTER — Ambulatory Visit: Payer: 59 | Admitting: Physician Assistant

## 2019-10-11 ENCOUNTER — Other Ambulatory Visit: Payer: Self-pay

## 2019-10-11 ENCOUNTER — Ambulatory Visit (HOSPITAL_COMMUNITY)
Admission: RE | Admit: 2019-10-11 | Discharge: 2019-10-11 | Disposition: A | Discharge status: Home / Self Care | Payer: 59 | Source: Ambulatory Visit | Attending: Cardiovascular Disease | Admitting: Cardiovascular Disease

## 2019-10-11 VITALS — BP 146/82 | HR 83 | Ht 71.0 in | Wt 211.1 lb

## 2019-10-11 DIAGNOSIS — I255 Ischemic cardiomyopathy: Secondary | ICD-10-CM

## 2019-10-11 DIAGNOSIS — R0989 Other specified symptoms and signs involving the circulatory and respiratory systems: Secondary | ICD-10-CM | POA: Diagnosis not present

## 2019-10-11 DIAGNOSIS — I25810 Atherosclerosis of coronary artery bypass graft(s) without angina pectoris: Secondary | ICD-10-CM

## 2019-10-11 DIAGNOSIS — I10 Essential (primary) hypertension: Secondary | ICD-10-CM | POA: Diagnosis not present

## 2019-10-11 DIAGNOSIS — I70208 Unspecified atherosclerosis of native arteries of extremities, other extremity: Secondary | ICD-10-CM

## 2019-10-11 DIAGNOSIS — R7309 Other abnormal glucose: Secondary | ICD-10-CM

## 2019-10-11 DIAGNOSIS — E785 Hyperlipidemia, unspecified: Secondary | ICD-10-CM

## 2019-10-11 MED ORDER — CARVEDILOL 12.5 MG PO TABS: 12.5000 mg | ORAL_TABLET | Freq: Two times a day (BID) | ORAL | 3 refills | Status: DC

## 2019-10-11 NOTE — Patient Instructions (Signed)
Medication Instructions:  Your physician has recommended you make the following change in your medication:   INCREASE: carvedilol (coreg) to 12.5 mg by mouth twice a day  *If you need a refill on your cardiac medications before your next appointment, please call your pharmacy*   Lab Work: Your physician recommends that you return for a FASTING lipid profile and liver function test on 01/10/20  If you have labs (blood work) drawn today and your tests are completely normal, you will receive your results only by:  MyChart Message (if you have MyChart) OR  A paper copy in the mail If you have any lab test that is abnormal or we need to change your treatment, we will call you to review the results.   Testing/Procedures: Your physician has requested that you have an upper extremity arterial duplex, TODAY at 1:30 PM. Please arrive at 1:15 PM This test is an ultrasound of the arteries in the arms. It looks at arterial blood flow in the arms. Allow one hour for Upper Arterial scans. There are no restrictions or special instructions    Follow-Up: Follow up with Dr. Katrinka Blazing on 12/13/19 at 1:20 PM   Other Instructions None

## 2019-10-11 NOTE — Progress Notes (Signed)
Call received from vascular tech reporting the patient's dominant right radial artery appears to be thrombosed. Jacolyn Reedy, PA made aware. She called and spoke to Dr. Myra Gianotti at [facility] and he will arrange for patient to be seen in their office on 6/17 or 6/18. Referral has been placed. Vascular tech made aware that patient may leave and report to ER if he develops acute pain or loss of feeling in his right hand.

## 2019-10-13 ENCOUNTER — Other Ambulatory Visit: Payer: Self-pay

## 2019-10-13 ENCOUNTER — Encounter (HOSPITAL_COMMUNITY): Payer: Self-pay | Admitting: Vascular Surgery

## 2019-10-13 ENCOUNTER — Other Ambulatory Visit (HOSPITAL_COMMUNITY)
Admission: RE | Admit: 2019-10-13 | Discharge: 2019-10-13 | Disposition: A | Discharge status: Home / Self Care | Payer: 59 | Source: Ambulatory Visit | Attending: Vascular Surgery | Admitting: Vascular Surgery

## 2019-10-13 ENCOUNTER — Encounter: Payer: Self-pay | Admitting: Vascular Surgery

## 2019-10-13 ENCOUNTER — Ambulatory Visit: Payer: 59 | Admitting: Vascular Surgery

## 2019-10-13 VITALS — BP 132/82 | HR 77 | Temp 98.6°F | Resp 20 | Ht 71.0 in | Wt 210.0 lb

## 2019-10-13 DIAGNOSIS — Z20822 Contact with and (suspected) exposure to covid-19: Secondary | ICD-10-CM | POA: Diagnosis not present

## 2019-10-13 DIAGNOSIS — I742 Embolism and thrombosis of arteries of the upper extremities: Secondary | ICD-10-CM | POA: Diagnosis not present

## 2019-10-13 DIAGNOSIS — Z01812 Encounter for preprocedural laboratory examination: Secondary | ICD-10-CM | POA: Insufficient documentation

## 2019-10-13 NOTE — Anesthesia Preprocedure Evaluation (Addendum)
Anesthesia Evaluation  Patient identified by MRN, date of birth, ID band Patient awake    Reviewed: Allergy & Precautions, NPO status , Patient's Chart, lab work & pertinent test results  History of Anesthesia Complications (+) PONV  Airway Mallampati: II  TM Distance: >3 FB Neck ROM: Full    Dental  (+) Dental Advisory Given   Pulmonary former smoker,  10/13/2019 SARS coronavirus NEG   breath sounds clear to auscultation       Cardiovascular hypertension, Pt. on medications and Pt. on home beta blockers (-) angina+ CAD, + Past MI, + CABG (09/19/2019) and + Peripheral Vascular Disease   Rhythm:Regular Rate:Normal  08/2019 ECHO: akinesis of the mid to distal anterior wall, anterior septum, and apex. There were no other  wall motion abnormalities. The ejection fraction was estimated at 40%, valves OK   Neuro/Psych negative neurological ROS     GI/Hepatic Neg liver ROS, GERD  Controlled,  Endo/Other  negative endocrine ROS  Renal/GU negative Renal ROS     Musculoskeletal   Abdominal   Peds  Hematology negative hematology ROS (+)   Anesthesia Other Findings   Reproductive/Obstetrics                            Anesthesia Physical Anesthesia Plan  ASA: III  Anesthesia Plan: MAC   Post-op Pain Management:    Induction:   PONV Risk Score and Plan: 1 and Ondansetron and Treatment may vary due to age or medical condition  Airway Management Planned: Natural Airway and Simple Face Mask  Additional Equipment:   Intra-op Plan:   Post-operative Plan:   Informed Consent: I have reviewed the patients History and Physical, chart, labs and discussed the procedure including the risks, benefits and alternatives for the proposed anesthesia with the patient or authorized representative who has indicated his/her understanding and acceptance.     Dental advisory given  Plan Discussed with: CRNA  and Surgeon  Anesthesia Plan Comments:        Anesthesia Quick Evaluation

## 2019-10-13 NOTE — Progress Notes (Signed)
Patient ID: [name], male   DOB: 06-16-1960, 59 y.o.   MRN: [id number]  Reason for Consult: New Patient (Initial Visit)   Referred by Imogene Burn, PA-C  Subjective:     HPI:  [name] is a 59 y.o. male vascular risk factor hypertension hyperlipidemia and former smoker recently underwent emergent coronary artery bypass grafting.  At that time he underwent coronary angiography via right radial approach.  There was no imaging of the right upper extremity at that time no complications were noted.  He also had a radial A-line on the right.  Per the patient and his wife they had difficulty detecting his oxygen saturations on the right after surgery.  He did note numbness and tingling in all 4 of his fingers his thumb was spared.  He now has persistent numbness and tingling in his index middle and ring finger on the right.  No symptoms in the thumb or small finger.  He is able to use the hand and he denies any pain.  Does not have any tissue loss.  Past Medical History:  Diagnosis Date  . Hyperlipidemia   . Hypertension   . Myocardial infarction [facility])    History reviewed. No pertinent family history. Past Surgical History:  Procedure Laterality Date  . CORONARY ARTERY BYPASS GRAFT N/A 09/19/2019   Procedure: CORONARY ARTERY BYPASS GRAFTING (CABG) using LIMA to LAD; Endoscopic Right Greater Saphenous Vein Harvest: SVG to Diag.;  Surgeon: Grace Isaac, MD;  Location: [place];  Service: Open Heart Surgery;  Laterality: N/A;  . CORONARY BALLOON ANGIOPLASTY N/A 09/19/2019   Procedure: CORONARY BALLOON ANGIOPLASTY;  Surgeon: Belva Crome, MD;  Location: [place] CV LAB;  Service: Cardiovascular;  Laterality: N/A;  . ENDOVEIN HARVEST OF GREATER SAPHENOUS VEIN Right 09/19/2019   Procedure: [place] Of Greater Saphenous Vein;  Surgeon: Grace Isaac, MD;  Location: [place];  Service: Open Heart Surgery;  Laterality: Right;  . IABP INSERTION N/A 09/19/2019   Procedure: IABP  Insertion;  Surgeon: Belva Crome, MD;  Location: [place] [facility];  Service: Cardiovascular;  Laterality: N/A;  . LEFT HEART CATH AND CORONARY ANGIOGRAPHY N/A 09/19/2019   Procedure: LEFT HEART CATH AND CORONARY ANGIOGRAPHY;  Surgeon: Belva Crome, MD;  Location: [place] [facility];  Service: Cardiovascular;  Laterality: N/A;  . TEE WITHOUT CARDIOVERSION N/A 09/19/2019   Procedure: TRANSESOPHAGEAL ECHOCARDIOGRAM (TEE);  Surgeon: Grace Isaac, MD;  Location: [place];  Service: Open Heart Surgery;  Laterality: N/A;    Short Social History:  Social History   Tobacco Use  . Smoking status: Former Smoker    Types: Cigarettes    Quit date: 2001    Years since quitting: 20.4  . Smokeless tobacco: Never Used  Substance Use Topics  . Alcohol use: Yes    Comment: occasional    Allergies  Allergen Reactions  . Morphine And Related Nausea And Vomiting    Current Outpatient Medications  Medication Sig Dispense Refill  . acetaminophen (TYLENOL) 325 MG tablet Take 2 tablets (650 mg total) by mouth every 6 (six) hours as needed for mild pain.    Marland Kitchen aspirin EC 81 MG EC tablet Take 1 tablet (81 mg total) by mouth daily.    Marland Kitchen atorvastatin (LIPITOR) 80 MG tablet Take 1 tablet (80 mg total) by mouth daily. 30 tablet 1  . calcium carbonate (TUMS - DOSED IN MG ELEMENTAL CALCIUM) 500 MG chewable tablet Chew 1 tablet  by mouth daily as needed for indigestion or heartburn.    . carvedilol (COREG) 12.5 MG tablet Take 1 tablet (12.5 mg total) by mouth 2 (two) times daily with a meal. 180 tablet 3  . clopidogrel (PLAVIX) 75 MG tablet Take 1 tablet (75 mg total) by mouth daily. 30 tablet 1  . lisinopril (ZESTRIL) 2.5 MG tablet Take 1 tablet (2.5 mg total) by mouth daily. 30 tablet 1   No current facility-administered medications for this visit.    Review of Systems  Constitutional:  Constitutional negative. HENT: HENT negative.  Eyes: Eyes negative.  Respiratory: Respiratory negative.    Cardiovascular: Cardiovascular negative.  GI: Gastrointestinal negative.  Musculoskeletal: Musculoskeletal negative.  Skin: Skin negative.  Neurological: Positive for numbness.  Hematologic: Hematologic/lymphatic negative.  Psychiatric: Psychiatric negative.        Objective:  Objective   Vitals:   10/13/19 1124  BP: 132/82  Pulse: 77  Resp: 20  Temp: 98.6 F (37 C)  SpO2: 98%  Weight: 210 lb (95.3 kg)  Height: 5\' 11"  (1.803 m)   Body mass index is 29.29 kg/m.  Physical Exam HENT:     Head: Normocephalic.     Mouth/Throat:     Comments: Mask in place Eyes:     Pupils: Pupils are equal, round, and reactive to light.  Cardiovascular:     Comments: Radial artery signal on the right that is absent with compression of the ulnar artery.  Ulnar artery is not palpable but there is a strong signal in his palmar arch is ulnar artery dependent Pulmonary:     Effort: Pulmonary effort is normal.  Abdominal:     General: Abdomen is flat.     Palpations: Abdomen is soft. There is no mass.  Musculoskeletal:        General: No swelling. Normal range of motion.  Skin:    Capillary Refill: Capillary refill takes less than 2 seconds.  Neurological:     General: No focal deficit present.     Mental Status: He is alert.  Psychiatric:        Mood and Affect: Mood normal.     Data: I reviewed his right upper extremity duplex which demonstrates triphasic waveform to the level of the brachial artery.  The radial artery is occluded the ulnar artery distally has biphasic flow.  Radial artery appeared to be the dominant artery.     Assessment/Plan:     59 year old male with what appears to be thrombosed right radial artery which was likely the dominant artery to his hand now with numbness of his index through ring fingers on the right.  I have offered the patient angiography possible intervention versus no intervention versus open thrombectomy.  Patient has elected for open  thrombectomy we will proceed with this tomorrow in the operating room as an outpatient.     46 MD [facility]

## 2019-10-14 ENCOUNTER — Encounter (HOSPITAL_COMMUNITY)
Admission: RE | Disposition: A | Discharge status: Home / Self Care | Payer: Self-pay | Source: Home / Self Care | Attending: Vascular Surgery

## 2019-10-14 ENCOUNTER — Ambulatory Visit (HOSPITAL_COMMUNITY): Payer: 59 | Admitting: Anesthesiology

## 2019-10-14 ENCOUNTER — Encounter (HOSPITAL_COMMUNITY): Payer: Self-pay | Admitting: Vascular Surgery

## 2019-10-14 ENCOUNTER — Ambulatory Visit (HOSPITAL_COMMUNITY)
Admission: RE | Admit: 2019-10-14 | Discharge: 2019-10-14 | Disposition: A | Discharge status: Home / Self Care | Payer: 59 | Attending: Vascular Surgery | Admitting: Vascular Surgery

## 2019-10-14 ENCOUNTER — Other Ambulatory Visit: Payer: Self-pay

## 2019-10-14 DIAGNOSIS — I742 Embolism and thrombosis of arteries of the upper extremities: Secondary | ICD-10-CM | POA: Diagnosis present

## 2019-10-14 DIAGNOSIS — E785 Hyperlipidemia, unspecified: Secondary | ICD-10-CM | POA: Diagnosis not present

## 2019-10-14 DIAGNOSIS — Z87891 Personal history of nicotine dependence: Secondary | ICD-10-CM | POA: Diagnosis not present

## 2019-10-14 DIAGNOSIS — I2510 Atherosclerotic heart disease of native coronary artery without angina pectoris: Secondary | ICD-10-CM | POA: Insufficient documentation

## 2019-10-14 DIAGNOSIS — I252 Old myocardial infarction: Secondary | ICD-10-CM | POA: Insufficient documentation

## 2019-10-14 DIAGNOSIS — Z951 Presence of aortocoronary bypass graft: Secondary | ICD-10-CM | POA: Insufficient documentation

## 2019-10-14 DIAGNOSIS — Z79899 Other long term (current) drug therapy: Secondary | ICD-10-CM | POA: Diagnosis not present

## 2019-10-14 DIAGNOSIS — Z7982 Long term (current) use of aspirin: Secondary | ICD-10-CM | POA: Diagnosis not present

## 2019-10-14 DIAGNOSIS — Z7902 Long term (current) use of antithrombotics/antiplatelets: Secondary | ICD-10-CM | POA: Insufficient documentation

## 2019-10-14 HISTORY — DX: Nausea with vomiting, unspecified: R11.2

## 2019-10-14 HISTORY — PX: THROMBECTOMY BRACHIAL ARTERY: SHX6649

## 2019-10-14 HISTORY — DX: Gastro-esophageal reflux disease without esophagitis: K21.9

## 2019-10-14 HISTORY — DX: Nausea with vomiting, unspecified: Z98.890

## 2019-10-14 HISTORY — DX: Atherosclerotic heart disease of native coronary artery without angina pectoris: I25.10

## 2019-10-14 LAB — POCT I-STAT, CHEM 8
BUN: 14 mg/dL (ref 6–20)
Calcium, Ion: 1.12 mmol/L — ABNORMAL LOW (ref 1.15–1.40)
Chloride: 103 mmol/L (ref 98–111)
Creatinine, Ser: 0.9 mg/dL (ref 0.61–1.24)
Glucose, Bld: 104 mg/dL — ABNORMAL HIGH (ref 70–99)
HCT: 40 % (ref 39.0–52.0)
Hemoglobin: 13.6 g/dL (ref 13.0–17.0)
Potassium: 4 mmol/L (ref 3.5–5.1)
Sodium: 138 mmol/L (ref 135–145)
TCO2: 25 mmol/L (ref 22–32)

## 2019-10-14 LAB — SARS CORONAVIRUS 2 (TAT 6-24 HRS): SARS Coronavirus 2: NEGATIVE

## 2019-10-14 SURGERY — THROMBECTOMY, ARTERY, BRACHIAL
Anesthesia: Monitor Anesthesia Care | Laterality: Right

## 2019-10-14 MED ORDER — FENTANYL CITRATE (PF) 250 MCG/5ML IJ SOLN
INTRAMUSCULAR | Status: AC
  Filled 2019-10-14: qty 5

## 2019-10-14 MED ORDER — PROPOFOL 10 MG/ML IV BOLUS
INTRAVENOUS | Status: AC
  Filled 2019-10-14: qty 20

## 2019-10-14 MED ORDER — HEPARIN SODIUM (PORCINE) 1000 UNIT/ML IJ SOLN
INTRAMUSCULAR | Status: DC | PRN
Start: 2019-10-14 — End: 2019-10-14
  Administered 2019-10-14: 10000 [IU] via INTRAVENOUS

## 2019-10-14 MED ORDER — MEPERIDINE HCL 25 MG/ML IJ SOLN: 6.2500 mg | INTRAMUSCULAR | Status: DC | PRN

## 2019-10-14 MED ORDER — PROTAMINE SULFATE 10 MG/ML IV SOLN
INTRAVENOUS | Status: DC | PRN
Start: 2019-10-14 — End: 2019-10-14
  Administered 2019-10-14: 50 mg via INTRAVENOUS

## 2019-10-14 MED ORDER — FENTANYL CITRATE (PF) 100 MCG/2ML IJ SOLN
INTRAMUSCULAR | Status: DC | PRN
  Administered 2019-10-14: 50 ug via INTRAVENOUS

## 2019-10-14 MED ORDER — CHLORHEXIDINE GLUCONATE 0.12 % MT SOLN
OROMUCOSAL | Status: AC
  Administered 2019-10-14: 15 mL via OROMUCOSAL
  Filled 2019-10-14: qty 15

## 2019-10-14 MED ORDER — CHLORHEXIDINE GLUCONATE 4 % EX LIQD: 60.0000 mL | Freq: Once | CUTANEOUS | Status: DC

## 2019-10-14 MED ORDER — DEXAMETHASONE SODIUM PHOSPHATE 10 MG/ML IJ SOLN
INTRAMUSCULAR | Status: AC
  Filled 2019-10-14: qty 1

## 2019-10-14 MED ORDER — LIDOCAINE HCL 1 % IJ SOLN
INTRAMUSCULAR | Status: AC
  Filled 2019-10-14: qty 20

## 2019-10-14 MED ORDER — FENTANYL CITRATE (PF) 100 MCG/2ML IJ SOLN: 25.0000 ug | INTRAMUSCULAR | Status: DC | PRN

## 2019-10-14 MED ORDER — ONDANSETRON HCL 4 MG/2ML IJ SOLN
INTRAMUSCULAR | Status: AC
  Filled 2019-10-14: qty 2

## 2019-10-14 MED ORDER — SODIUM CHLORIDE 0.9 % IV SOLN
INTRAVENOUS | Status: DC | PRN
  Administered 2019-10-14: 500 mL

## 2019-10-14 MED ORDER — LIDOCAINE 2% (20 MG/ML) 5 ML SYRINGE
INTRAMUSCULAR | Status: AC
  Filled 2019-10-14: qty 5

## 2019-10-14 MED ORDER — HYDROCODONE-ACETAMINOPHEN 5-325 MG PO TABS: 1.0000 | ORAL_TABLET | Freq: Four times a day (QID) | ORAL | 0 refills | Status: DC | PRN

## 2019-10-14 MED ORDER — HEPARIN SODIUM (PORCINE) 1000 UNIT/ML IJ SOLN
INTRAMUSCULAR | Status: AC
  Filled 2019-10-14: qty 1

## 2019-10-14 MED ORDER — ONDANSETRON HCL 4 MG/2ML IJ SOLN
INTRAMUSCULAR | Status: DC | PRN
  Administered 2019-10-14: 4 mg via INTRAVENOUS

## 2019-10-14 MED ORDER — 0.9 % SODIUM CHLORIDE (POUR BTL) OPTIME
TOPICAL | Status: DC | PRN
  Administered 2019-10-14: 1000 mL

## 2019-10-14 MED ORDER — PROMETHAZINE HCL 25 MG/ML IJ SOLN: 6.2500 mg | INTRAMUSCULAR | Status: DC | PRN

## 2019-10-14 MED ORDER — PHENYLEPHRINE 40 MCG/ML (10ML) SYRINGE FOR IV PUSH (FOR BLOOD PRESSURE SUPPORT)
PREFILLED_SYRINGE | INTRAVENOUS | Status: DC | PRN
  Administered 2019-10-14: 40 ug via INTRAVENOUS
  Administered 2019-10-14: 80 ug via INTRAVENOUS

## 2019-10-14 MED ORDER — SODIUM CHLORIDE 0.9 % IV SOLN: INTRAVENOUS | Status: DC

## 2019-10-14 MED ORDER — PROPOFOL 500 MG/50ML IV EMUL
INTRAVENOUS | Status: DC | PRN
  Administered 2019-10-14: 50 ug/kg/min via INTRAVENOUS

## 2019-10-14 MED ORDER — LIDOCAINE-EPINEPHRINE (PF) 1 %-1:200000 IJ SOLN
INTRAMUSCULAR | Status: DC | PRN
  Administered 2019-10-14: 11 mL

## 2019-10-14 MED ORDER — ROCURONIUM BROMIDE 10 MG/ML (PF) SYRINGE
PREFILLED_SYRINGE | INTRAVENOUS | Status: AC
  Filled 2019-10-14: qty 10

## 2019-10-14 MED ORDER — PROTAMINE SULFATE 10 MG/ML IV SOLN
INTRAVENOUS | Status: AC
  Filled 2019-10-14: qty 5

## 2019-10-14 MED ORDER — PROPOFOL 1000 MG/100ML IV EMUL
INTRAVENOUS | Status: AC
  Filled 2019-10-14: qty 100

## 2019-10-14 MED ORDER — SODIUM CHLORIDE 0.9 % IV SOLN
INTRAVENOUS | Status: AC
  Filled 2019-10-14: qty 1.2

## 2019-10-14 MED ORDER — HEMOSTATIC AGENTS (NO CHARGE) OPTIME
TOPICAL | Status: DC | PRN
  Administered 2019-10-14: 1 via TOPICAL

## 2019-10-14 MED ORDER — MIDAZOLAM HCL 2 MG/2ML IJ SOLN
INTRAMUSCULAR | Status: AC
  Filled 2019-10-14: qty 2

## 2019-10-14 MED ORDER — CEFAZOLIN SODIUM-DEXTROSE 2-4 GM/100ML-% IV SOLN
2.0000 g | INTRAVENOUS | Status: AC
  Administered 2019-10-14: 2 g via INTRAVENOUS
  Filled 2019-10-14: qty 100

## 2019-10-14 MED ORDER — CARVEDILOL 12.5 MG PO TABS
12.5000 mg | ORAL_TABLET | Freq: Once | ORAL | Status: AC
  Administered 2019-10-14: 12.5 mg via ORAL
  Filled 2019-10-14: qty 1

## 2019-10-14 MED ORDER — MIDAZOLAM HCL 2 MG/2ML IJ SOLN: 0.5000 mg | Freq: Once | INTRAMUSCULAR | Status: DC | PRN

## 2019-10-14 MED ORDER — CHLORHEXIDINE GLUCONATE 0.12 % MT SOLN
15.0000 mL | Freq: Once | OROMUCOSAL | Status: AC
  Filled 2019-10-14: qty 15

## 2019-10-14 MED ORDER — LIDOCAINE-EPINEPHRINE (PF) 1 %-1:200000 IJ SOLN
INTRAMUSCULAR | Status: AC
  Filled 2019-10-14: qty 30

## 2019-10-14 MED ORDER — MIDAZOLAM HCL 5 MG/5ML IJ SOLN
INTRAMUSCULAR | Status: DC | PRN
  Administered 2019-10-14 (×2): 1 mg via INTRAVENOUS

## 2019-10-14 SURGICAL SUPPLY — 38 items
ADH SKN CLS APL DERMABOND .7 (GAUZE/BANDAGES/DRESSINGS) ×1
ADH SKN CLS LQ APL DERMABOND (GAUZE/BANDAGES/DRESSINGS) ×1
ARMBAND PINK RESTRICT EXTREMIT (MISCELLANEOUS) ×3 IMPLANT
CANISTER SUCT 3000ML PPV (MISCELLANEOUS) ×3 IMPLANT
CATH EMB 3FR 80CM (CATHETERS) IMPLANT
CATH EMB 4FR 80CM (CATHETERS) IMPLANT
CATH EMB 5FR 80CM (CATHETERS) IMPLANT
CLIP VESOCCLUDE MED 6/CT (CLIP) ×3 IMPLANT
CLIP VESOCCLUDE SM WIDE 6/CT (CLIP) ×3 IMPLANT
COVER PROBE W GEL 5X96 (DRAPES) ×1 IMPLANT
COVER WAND RF STERILE (DRAPES) ×3 IMPLANT
DERMABOND ADHESIVE PROPEN (GAUZE/BANDAGES/DRESSINGS) ×2
DERMABOND ADVANCED (GAUZE/BANDAGES/DRESSINGS) ×2
DERMABOND ADVANCED .7 DNX12 (GAUZE/BANDAGES/DRESSINGS) ×1 IMPLANT
DERMABOND ADVANCED .7 DNX6 (GAUZE/BANDAGES/DRESSINGS) IMPLANT
ELECT REM PT RETURN 9FT ADLT (ELECTROSURGICAL) ×3
ELECTRODE REM PT RTRN 9FT ADLT (ELECTROSURGICAL) ×1 IMPLANT
GLOVE BIO SURGEON STRL SZ7 (GLOVE) ×4 IMPLANT
GLOVE BIO SURGEON STRL SZ7.5 (GLOVE) ×5 IMPLANT
GLOVE BIOGEL PI IND STRL 7.0 (GLOVE) IMPLANT
GLOVE BIOGEL PI INDICATOR 7.0 (GLOVE) ×6
GOWN STRL REUS W/ TWL LRG LVL3 (GOWN DISPOSABLE) ×2 IMPLANT
GOWN STRL REUS W/ TWL XL LVL3 (GOWN DISPOSABLE) ×1 IMPLANT
GOWN STRL REUS W/TWL LRG LVL3 (GOWN DISPOSABLE) ×6
GOWN STRL REUS W/TWL XL LVL3 (GOWN DISPOSABLE) ×6
HEMOSTAT SNOW SURGICEL 2X4 (HEMOSTASIS) ×2 IMPLANT
KIT BASIN OR (CUSTOM PROCEDURE TRAY) ×3 IMPLANT
KIT TURNOVER KIT B (KITS) ×3 IMPLANT
NS IRRIG 1000ML POUR BTL (IV SOLUTION) ×3 IMPLANT
PACK CV ACCESS (CUSTOM PROCEDURE TRAY) ×3 IMPLANT
PAD ARMBOARD 7.5X6 YLW CONV (MISCELLANEOUS) ×6 IMPLANT
SUT MNCRL AB 4-0 PS2 18 (SUTURE) ×3 IMPLANT
SUT PROLENE 6 0 BV (SUTURE) ×5 IMPLANT
SUT VIC AB 3-0 SH 27 (SUTURE) ×3
SUT VIC AB 3-0 SH 27X BRD (SUTURE) ×1 IMPLANT
TOWEL GREEN STERILE (TOWEL DISPOSABLE) ×3 IMPLANT
UNDERPAD 30X36 HEAVY ABSORB (UNDERPADS AND DIAPERS) ×3 IMPLANT
WATER STERILE IRR 1000ML POUR (IV SOLUTION) ×3 IMPLANT

## 2019-10-14 NOTE — Transfer of Care (Signed)
Immediate Anesthesia Transfer of Care Note  Patient: [name]  Procedure(s) Performed: THROMBECTOMY RIGHT RADIAL ARTERY (Right )  Patient Location: PACU  Anesthesia Type:MAC  Level of Consciousness: awake, alert , oriented and patient cooperative  Airway & Oxygen Therapy: Patient Spontanous Breathing  Post-op Assessment: Report given to RN and Post -op Vital signs reviewed and stable  Post vital signs: Reviewed and stable  Last Vitals:  Vitals Value Taken Time  BP 111/74 10/14/19 0850  Temp 36.8 C 10/14/19 0850  Pulse 76 10/14/19 0850  Resp 17 10/14/19 0850  SpO2 100 % 10/14/19 0850    Last Pain:  Vitals:   10/14/19 0835  TempSrc:   PainSc: 0-No pain      Patients Stated Pain Goal: 6 (37/85/88 5027)  Complications: No complications documented.

## 2019-10-14 NOTE — Anesthesia Postprocedure Evaluation (Signed)
Anesthesia Post Note  Patient: Stevin Bielinski Eckerman  Procedure(s) Performed: THROMBECTOMY RIGHT RADIAL ARTERY (Right )     Patient location during evaluation: PACU Anesthesia Type: MAC Level of consciousness: awake and alert, oriented and patient cooperative Pain management: pain level controlled Vital Signs Assessment: post-procedure vital signs reviewed and stable Respiratory status: spontaneous breathing, nonlabored ventilation and respiratory function stable Cardiovascular status: blood pressure returned to baseline and stable Postop Assessment: no apparent nausea or vomiting and adequate PO intake Anesthetic complications: no   No complications documented.  Last Vitals:  Vitals:   10/14/19 0835 10/14/19 0850  BP: (!) 106/56 111/74  Pulse: 84 76  Resp: 18 17  Temp: (!) 36.2 C 36.8 C  SpO2: 100% 100%    Last Pain:  Vitals:   10/14/19 0835  TempSrc:   PainSc: 0-No pain                 Dela Sweeny,E. Rekisha Welling

## 2019-10-14 NOTE — Anesthesia Procedure Notes (Signed)
Procedure Name: MAC Date/Time: 10/14/2019 7:42 AM Performed by: Lowella Dell, CRNA Pre-anesthesia Checklist: Patient identified, Emergency Drugs available, Suction available, Patient being monitored and Timeout performed Patient Re-evaluated:Patient Re-evaluated prior to induction Oxygen Delivery Method: Simple face mask Placement Confirmation: positive ETCO2 Dental Injury: Teeth and Oropharynx as per pre-operative assessment

## 2019-10-14 NOTE — Op Note (Signed)
    Patient name: [name] MRN: [id number] DOB: 05-12-1960 Sex: male  10/14/2019 Pre-operative Diagnosis: Occluded right radial artery Post-operative diagnosis:  Same Surgeon:  Erlene Quan C. Donzetta Matters, MD Blood: Arlee Muslim, [place] Procedure Performed:  Right radial artery thrombectomy  Indications: 59 year old male coronary artery angiography from a right radial approach.  He subsequently underwent coronary artery bypass grafting.  After discharge from the hospital patient noticed that he has coolness and numbness to his index through ring finger on the right.  Duplex ultrasound demonstrated occlusion of his right radial artery which appeared to be the dominant artery to have flow via the ulnar.  Findings: There was acute appearing thrombus in the radial artery.  After thrombectomy we had strong inflow there was a palpable radial artery pulse distal confirmed with Doppler at completion.   Procedure:  The patient was identified in the holding area and taken to the operating where is placed supine on the operative table and MAC anesthesia induced.  He was sterilely prepped and draped in the right upper extremity in the usual fashion antibiotics were administered a timeout was called.  His right wrist was anesthetized at the site of the previous cannulation.  Longitudinal incision was made.  We dissected sharply down to the level of the radial artery and placed Vesseloops around the artery proximally and distally.  Patient was administered 10 times minutes of heparin.  Transverse arteriotomy was performed.  A 2 Fogarty was passed distally to 5 cm and that we did not return any gross clot we did have improved backbleeding.  We then passed the Fogarty up to 30 cm proximally.  We returned significant amounts of acute appearing thrombus at that time.  We then had strong inflow.  We passed 2 more times until the third pass was clean and was up to 50 cm.  After that we irrigated the arteriotomy with heparinized saline.   We closed the arteriotomy with interrupted 6-0 Prolene suture.  There was good radial artery pulse distally.  This was confirmed with Doppler.  We irrigated the wound obtain hemostasis and closed in layers of Vicryl and Monocryl.  Dermabond was placed at the level of skin.  He was awakened from anesthesia having tolerated procedure without any complication.  All counts were correct at completion.  EBL: 50 cc   Jacalynn Buzzell C. Donzetta Matters, MD [facility] [place] Office: [phone] Pager: [phone]

## 2019-10-14 NOTE — Discharge Instructions (Signed)
Incision Care, Adult An incision is a cut that a doctor makes in your skin for surgery (for a procedure). Most times, these cuts are closed after surgery. Your cut from surgery may be closed with stitches (sutures), staples, skin glue, or skin tape (adhesive strips). You may need to return to your doctor to have stitches or staples taken out. This may happen many days or many weeks after your surgery. The cut needs to be well cared for so it does not get infected. How to care for your cut Cut care   Follow instructions from your doctor about how to take care of your cut. Make sure you: ? Wash your hands with soap and water before you change your bandage (dressing). If you cannot use soap and water, use hand sanitizer. ? Change your bandage as told by your doctor. ? Leave stitches, skin glue, or skin tape in place. They may need to stay in place for 2 weeks or longer. If tape strips get loose and curl up, you may trim the loose edges. Do not remove tape strips completely unless your doctor says it is okay.  Check your cut area every day for signs of infection. Check for: ? More redness, swelling, or pain. ? More fluid or blood. ? Warmth. ? Pus or a bad smell.  Ask your doctor how to clean the cut. This may include: ? Using mild soap and water. ? Using a clean towel to pat the cut dry after you clean it. ? Putting a cream or ointment on the cut. Do this only as told by your doctor. ? Covering the cut with a clean bandage.  Ask your doctor when you can leave the cut uncovered.  Do not take baths, swim, or use a hot tub until your doctor says it is okay. Ask your doctor if you can take showers. You may only be allowed to take sponge baths for bathing. Medicines  If you were prescribed an antibiotic medicine, cream, or ointment, take the antibiotic or put it on the cut as told by your doctor. Do not stop taking or putting on the antibiotic even if your condition gets better.  Take  over-the-counter and prescription medicines only as told by your doctor. General instructions  Limit movement around your cut. This helps healing. ? Avoid straining, lifting, or exercise for the first month, or for as long as told by your doctor. ? Follow instructions from your doctor about going back to your normal activities. ? Ask your doctor what activities are safe.  Protect your cut from the sun when you are outside for the first 6 months, or for as long as told by your doctor. Put on sunscreen around the scar or cover up the scar.  Keep all follow-up visits as told by your doctor. This is important. Contact a doctor if:  Your have more redness, swelling, or pain around the cut.  You have more fluid or blood coming from the cut.  Your cut feels warm to the touch.  You have pus or a bad smell coming from the cut.  You have a fever or shaking chills.  You feel sick to your stomach (nauseous) or you throw up (vomit).  You are dizzy.  Your stitches or staples come undone. Get help right away if:  You have a red streak coming from your cut.  Your cut bleeds through the bandage and the bleeding does not stop with gentle pressure.  The edges of your cut   open up and separate.  You have very bad (severe) pain.  You have a rash.  You are confused.  You pass out (faint).  You have trouble breathing and you have a fast heartbeat. This information is not intended to replace advice given to you by your health care provider. Make sure you discuss any questions you have with your health care provider. Document Revised: 08/31/2016 Document Reviewed: 12/20/2015 Elsevier Patient Education  2020 Elsevier Inc.  

## 2019-10-14 NOTE — H&P (Signed)
   History and Physical Update  The patient was interviewed and re-examined.  The patient's previous History and Physical has been reviewed and is unchanged from yesterday. Plan for right arm thrombectomy today in OR.   Sherrick Araki C. Randie Heinz, MD [facility] [place] Office: [phone] Pager: [phone]   10/14/2019, 7:18 AM

## 2019-10-15 ENCOUNTER — Encounter (HOSPITAL_COMMUNITY): Payer: Self-pay | Admitting: Vascular Surgery

## 2019-10-18 ENCOUNTER — Other Ambulatory Visit: Payer: Self-pay | Admitting: Cardiothoracic Surgery

## 2019-10-18 DIAGNOSIS — Z951 Presence of aortocoronary bypass graft: Secondary | ICD-10-CM

## 2019-10-19 ENCOUNTER — Encounter: Payer: Self-pay | Admitting: Cardiothoracic Surgery

## 2019-10-19 ENCOUNTER — Ambulatory Visit (INDEPENDENT_AMBULATORY_CARE_PROVIDER_SITE_OTHER): Payer: Self-pay | Admitting: Cardiothoracic Surgery

## 2019-10-19 ENCOUNTER — Ambulatory Visit
Admission: RE | Admit: 2019-10-19 | Discharge: 2019-10-19 | Disposition: A | Discharge status: Home / Self Care | Payer: 59 | Source: Ambulatory Visit | Attending: Cardiothoracic Surgery | Admitting: Cardiothoracic Surgery

## 2019-10-19 ENCOUNTER — Other Ambulatory Visit: Payer: Self-pay

## 2019-10-19 VITALS — BP 127/75 | HR 79 | Temp 98.1°F | Resp 20 | Ht 71.0 in | Wt 211.0 lb

## 2019-10-19 DIAGNOSIS — Z951 Presence of aortocoronary bypass graft: Secondary | ICD-10-CM

## 2019-10-19 NOTE — Progress Notes (Signed)
[address]       [place] [address]             [phone]      [name] Medical Record #[id number] Date of Birth: 1961/04/01  Referring: Lyn Records, MD Primary Care: Patient, No Pcp Per Primary Cardiologist: Lyn Records III, MD   Chief Complaint:   POST OP FOLLOW UP OPERATIVE REPORT DATE OF PROCEDURE:  09/19/2019 PREOPERATIVE DIAGNOSIS:  Presentation with ST segment elevation myocardial infarction involving left anterior descending and diagonal. POSTOPERATIVE DIAGNOSIS:  Presentation with ST segment elevation myocardial infarction involving left anterior descending and diagonal. SURGICAL PROCEDURE:   1.  Emergency coronary artery bypass grafting x2 with the left internal mammary to the left anterior descending coronary artery and reverse saphenous vein graft to the diagonal coronary artery. 2.  Right thigh Endovein harvesting of the greater saphenous vein.    History of Present Illness:     Patient returns office today after recent emergency coronary artery bypass grafting done on Sep 19, 2019.  Since discharge the patient has been making good progress without signs or symptoms of congestive heart failure or angina.  He did note some tingling in numbness at the tip of his right fingers when he was seen in the cardiology office Doppler study revealed a duplicate radial artery system with the right radial artery thrombosed.  Several days ago he underwent thrombectomy by Dr. Randie Heinz to a limited incision over the radial artery, since then he notes significant improvement in his hand symptoms.      Past Medical History:  Diagnosis Date  . Coronary artery disease   . GERD (gastroesophageal reflux disease)    "depending on what I eat."  . Hyperlipidemia   . Hypertension   . Myocardial infarction (HCC)    anterior Stemi  . PONV (postoperative nausea and vomiting)    ? anesthesia or Morphine     Social History   Tobacco Use    Smoking Status Former Smoker  . Years: 23.00  . Types: Cigarettes  . Quit date: 2001  . Years since quitting: 20.4  Smokeless Tobacco Never Used    Social History   Substance and Sexual Activity  Alcohol Use Yes   Comment: occasional     Allergies  Allergen Reactions  . Morphine And Related Nausea And Vomiting    Current Outpatient Medications  Medication Sig Dispense Refill  . acetaminophen (TYLENOL) 325 MG tablet Take 2 tablets (650 mg total) by mouth every 6 (six) hours as needed for mild pain.    Marland Kitchen aspirin EC 81 MG EC tablet Take 1 tablet (81 mg total) by mouth daily.    Marland Kitchen atorvastatin (LIPITOR) 80 MG tablet Take 1 tablet (80 mg total) by mouth daily. 30 tablet 1  . carvedilol (COREG) 12.5 MG tablet Take 1 tablet (12.5 mg total) by mouth 2 (two) times daily with a meal. 180 tablet 3  . clopidogrel (PLAVIX) 75 MG tablet Take 1 tablet (75 mg total) by mouth daily. 30 tablet 1  . lisinopril (ZESTRIL) 2.5 MG tablet Take 1 tablet (2.5 mg total) by mouth daily. 30 tablet 1   No current facility-administered medications for this visit.       Physical Exam: BP 127/75   Pulse 79   Temp 98.1 F (36.7 C) (Temporal)   Resp 20   Ht 5\' 11"  (1.803 m)   Wt 211 lb (95.7 kg)  SpO2 96% Comment: RA  BMI 29.43 kg/m   General appearance: alert, cooperative and no distress Neurologic: intact Heart: regular rate and rhythm, S1, S2 normal, no murmur, click, rub or gallop Lungs: clear to auscultation bilaterally Abdomen: soft, non-tender; bowel sounds normal; no masses,  no organomegaly Extremities: extremities normal, atraumatic, no cyanosis or edema Wound: Sternal incision is well-healed, vein harvest site is healed, incision and right distal radial artery intact both hands are neurovascularly intact   Diagnostic Studies & Laboratory data:     Recent Radiology Findings:   DG Chest 2 View  Result Date: 10/19/2019 CLINICAL DATA:  Post CABG EXAM: CHEST - 2 VIEW COMPARISON:   09/23/2019 FINDINGS: Normal heart size post CABG. Mediastinal contours and pulmonary vascularity normal. Minimal residual LEFT basilar atelectasis. Lungs otherwise clear. No pulmonary infiltrate, pleural effusion or pneumothorax. Osseous structures unremarkable. IMPRESSION: Post CABG. Minimal residual LEFT basilar atelectasis. Electronically Signed   By: Lavonia Dana M.D.   On: 10/19/2019 08:55   I have independently reviewed the above radiology studies  and reviewed the findings with the patient.    Recent Lab Findings: Lab Results  Component Value Date   WBC 7.9 09/23/2019   HGB 13.6 10/14/2019   HCT 40.0 10/14/2019   PLT 187 09/23/2019   GLUCOSE 104 (H) 10/14/2019   CHOL 203 (H) 09/19/2019   TRIG 146 09/19/2019   HDL 43 09/19/2019   LDLCALC 131 (H) 09/19/2019   NA 138 10/14/2019   K 4.0 10/14/2019   CL 103 10/14/2019   CREATININE 0.90 10/14/2019   BUN 14 10/14/2019   CO2 29 09/23/2019   INR 1.4 (H) 09/19/2019   HGBA1C 6.3 (H) 09/19/2019      Assessment / Plan:   #1  Status post emergency coronary artery bypass grafting for ST elevation myocardial infarction with depressed LV function-now approximately 1 month postop doing well.  Remains on Plavix aspirin statin ACE inhibitor #2 patient to enroll in cardiac rehab program #3 patient allowed to drive, discussed returning to work part-time July 19 for 2 weeks and then full-time but limiting lifting over 25 pounds for 2 months.  Plan to see the patient back as necessary, followed up in cardiology for med titration and follow-up echocardiogram for LV function.   Medication Changes: No orders of the defined types were placed in this encounter.     Grace Isaac MD      [address] [place] [address] Office [phone]     10/19/2019 9:16 AM

## 2019-10-19 NOTE — Progress Notes (Signed)
Pt comes in today for suture removal.  He is s/p CABG on 5/25//21. Two chest tube incision sites to upper abdomen w/ sutures intact, incisions well approximated, and without s/s of infection. Sutures easily removed per standard protocol. Instructed pt to keep sites clean (w/ mild soap and water) and dry. To notify office of any s/s of infection or other problems.

## 2019-10-23 ENCOUNTER — Telehealth (HOSPITAL_COMMUNITY): Payer: Self-pay

## 2019-10-23 NOTE — Telephone Encounter (Signed)
Called and spoke with pt in regards to CR, adv pt of note below. Pt stated he was at an appt and would call back later. Placed pt ppw back on ready to schedule bin, on wall.

## 2019-10-25 ENCOUNTER — Telehealth (HOSPITAL_COMMUNITY): Payer: Self-pay

## 2019-10-25 NOTE — Telephone Encounter (Signed)
Called patient to see if he was interested in participating in the Cardiac Rehab Program. Patient stated yes. Patient will come in for orientation on 11/28/19 @ 130PM and will attend the 1:15PM exercise class.  Mailed letter

## 2019-11-03 ENCOUNTER — Ambulatory Visit (INDEPENDENT_AMBULATORY_CARE_PROVIDER_SITE_OTHER): Payer: Self-pay | Admitting: Vascular Surgery

## 2019-11-03 ENCOUNTER — Telehealth: Payer: Self-pay

## 2019-11-03 ENCOUNTER — Other Ambulatory Visit: Payer: Self-pay

## 2019-11-03 ENCOUNTER — Encounter: Payer: Self-pay | Admitting: Vascular Surgery

## 2019-11-03 VITALS — BP 122/74 | HR 73 | Temp 98.4°F | Resp 20 | Ht 71.0 in | Wt 207.0 lb

## 2019-11-03 DIAGNOSIS — I742 Embolism and thrombosis of arteries of the upper extremities: Secondary | ICD-10-CM

## 2019-11-03 NOTE — Telephone Encounter (Signed)
FMLA/[employer] was faxed to [phone], date of beginning leave 09/19/2019, RTW date approx 12/18/2019 Forms mailed to home address./

## 2019-11-03 NOTE — Progress Notes (Signed)
    Subjective:     Patient ID: [name], male   DOB: [birth year], 59 y.o.   MRN: [id number]  HPI 59 year old male status post right radial artery thromboembolectomy. This was secondary to either radial arterial line or radial artery catheterization. After surgery he has done very well.   Review of Systems Numbness right distal index finger    Objective:   Physical Exam Vitals:   11/03/19 1507  BP: 122/74  Pulse: 73  Resp: 20  Temp: 98.4 F (36.9 C)  SpO2: 97%   Awake alert oriented Right wrist incision well-healed Palpable radial artery pulse All fingers have brisk capillary refill    Assessment:     59 year old male status post right radial artery thromboembolectomy.    Plan:     Okay for right upper extremity activity as tolerated from vascular standpoint and can follow-up with me on an as-needed basis.  Tomie Elko C. Randie Heinz, MD [facility] [place] [facility] Office: [phone] Pager: [phone]

## 2019-11-12 ENCOUNTER — Other Ambulatory Visit: Payer: Self-pay | Admitting: Physician Assistant

## 2019-11-13 ENCOUNTER — Other Ambulatory Visit: Payer: Self-pay

## 2019-11-13 MED ORDER — ATORVASTATIN CALCIUM 80 MG PO TABS: 80.0000 mg | ORAL_TABLET | Freq: Every day | ORAL | 3 refills | Status: DC

## 2019-11-13 MED ORDER — LISINOPRIL 2.5 MG PO TABS: 2.5000 mg | ORAL_TABLET | Freq: Every day | ORAL | 3 refills | Status: DC

## 2019-11-13 MED ORDER — CLOPIDOGREL BISULFATE 75 MG PO TABS: 75.0000 mg | ORAL_TABLET | Freq: Every day | ORAL | 3 refills | Status: DC

## 2019-11-13 NOTE — Telephone Encounter (Signed)
Pt's medications were sent to pt's pharmacy as requested. Confirmation received.  

## 2019-11-20 ENCOUNTER — Telehealth (HOSPITAL_COMMUNITY): Payer: Self-pay | Admitting: Pharmacist

## 2019-11-21 NOTE — Telephone Encounter (Signed)
Cardiac Rehab Medication Review by a Pharmacist  Does the patient  feel that his/her medications are working for him/her?  yes  Has the patient been experiencing any side effects to the medications prescribed?  yes  Does the patient measure his/her own blood pressure or blood glucose at home?  yes   Does the patient have any problems obtaining medications due to transportation or finances?   no  Understanding of regimen: good Understanding of indications: good Potential of compliance: good    Pharmacist Intervention: Walked through patient's medication list and he indicated he is taking all medications on his current medication list. He brought up that he has been avoiding NSAIDs per the advice of his doctors. Patient has no problems obtaining his medications and feels that he is currently well controlled. Patient monitors his blood pressure with the help of his wife and states it has been within range recently though he could not remember the exact measurements. Reiterated his goal of 130/80.     Ike Bene 11/21/2019 3:47 PM

## 2019-11-27 ENCOUNTER — Telehealth (HOSPITAL_COMMUNITY): Payer: Self-pay

## 2019-11-27 NOTE — Telephone Encounter (Signed)
Cardiac Rehab Note:  Unsuccessful telephone call to [employer] [place] at given number to confirm cardiac rehab orientation appointment for 11/28/19 at 1:30 pm. [employer] compliant VM message left requesting call back to [phone].  [name] E. Suzie Portela RN, BSN [place]. [facility]  Cardiac and Pulmonary Rehabilitation Phone: [phone] Fax: [phone]

## 2019-11-28 ENCOUNTER — Encounter (HOSPITAL_COMMUNITY): Payer: Self-pay

## 2019-11-28 ENCOUNTER — Other Ambulatory Visit: Payer: Self-pay

## 2019-11-28 ENCOUNTER — Encounter (HOSPITAL_COMMUNITY)
Admission: RE | Admit: 2019-11-28 | Discharge: 2019-11-28 | Disposition: A | Discharge status: Home / Self Care | Payer: 59 | Source: Ambulatory Visit | Attending: Interventional Cardiology | Admitting: Interventional Cardiology

## 2019-11-28 VITALS — BP 144/80 | HR 78 | Ht 69.0 in | Wt 204.8 lb

## 2019-11-28 DIAGNOSIS — I2102 ST elevation (STEMI) myocardial infarction involving left anterior descending coronary artery: Secondary | ICD-10-CM | POA: Insufficient documentation

## 2019-11-28 NOTE — Progress Notes (Signed)
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  acetaminophen (TYLENOL) 325 MG tablet, Take 2 tablets (650 mg total) by mouth every 6 (six) hours as needed for mild pain., Disp: , Rfl:  .  aspirin EC 81 MG EC tablet, Take 1 tablet (81 mg total) by mouth daily., Disp: , Rfl:  .  atorvastatin (LIPITOR) 80 MG tablet, Take 1 tablet (80 mg total) by mouth daily., Disp: 90 tablet, Rfl: 3 .  carvedilol (COREG) 12.5 MG tablet, Take 1 tablet (12.5 mg total) by mouth 2 (two) times daily with a meal., Disp: 180 tablet, Rfl: 3 .  clopidogrel (PLAVIX) 75 MG tablet, Take 1 tablet (75 mg total) by mouth daily., Disp: 90 tablet, Rfl: 3 .  lisinopril (ZESTRIL) 2.5 MG tablet, Take 1 tablet (2.5 mg total) by mouth daily., Disp: 90 tablet, Rfl: 3  Past Medical History: Past Medical History:  Diagnosis Date  . Coronary artery disease   . GERD (gastroesophageal reflux disease)    "depending on what I eat."  . Hyperlipidemia   . Hypertension   . Myocardial infarction (HCC)    anterior Stemi  . PONV (postoperative nausea and vomiting)    ? anesthesia or Morphine    Tobacco Use: Social History   Tobacco Use  Smoking Status Former Smoker  . Years: 23.00  . Types: Cigarettes  . Quit date: 2001  . Years since quitting: 20.6  Smokeless Tobacco Never Used    Labs: Recent Review Flowsheet Data    Labs for ITP Cardiac and Pulmonary Rehab Latest Ref Rng & Units 09/19/2019 09/19/2019 09/20/2019 09/21/2019 10/14/2019    Cholestrol 0 - 200 mg/dL - - - - -   LDLCALC 0 - 99 mg/dL - - - - -   HDL >09 mg/dL - - - - -   Trlycerides <150 mg/dL - - - - -   Hemoglobin A1c 4.8 - 5.6 % - - - - -   PHART 7.35 - 7.45 7.330(L) 7.337(L) - - -   PCO2ART 32 - 48 mmHg 42.5 40.2 - - -   HCO3 20.0 - 28.0 mmol/L 22.4 21.5 - - -   TCO2 22 - 32 mmol/L 24 23 - - 25   ACIDBASEDEF 0.0 - 2.0 mmol/L 3.0(H) 4.0(H) - - -   O2SAT % 96.0 97.0 72.6 64.8 -      Capillary Blood Glucose: Lab Results  Component Value Date   GLUCAP 93 09/23/2019   GLUCAP 91 09/22/2019   GLUCAP 117 (H) 09/22/2019   GLUCAP 103 (H) 09/22/2019   GLUCAP 121 (H) 09/22/2019     Exercise Target Goals: Exercise Program Goal: Individual exercise prescription set using results from initial 6 min walk test and THRR while considering  patient's activity barriers and safety.   Exercise Prescription Goal: Starting with aerobic activity 30 plus minutes a day, 3 days per week for initial exercise prescription. Provide home exercise prescription and guidelines that participant acknowledges understanding prior to discharge.  Activity Barriers &  Risk Stratification:  Activity Barriers & Cardiac Risk Stratification - 11/28/19 1528      Activity Barriers & Cardiac Risk Stratification   Activity Barriers None    Cardiac Risk Stratification Low           6 Minute Walk:  6 Minute Walk    Row Name 11/28/19 1430         6 Minute Walk   Phase Initial     Distance 1698 feet     Walk Time 6 minutes     # of Rest Breaks 0     MPH 3.21     METS 4.33     RPE 12     Perceived Dyspnea  1  Pt voices due to mask     VO2 Peak 15.1     Symptoms Yes (comment)     Comments RPD=1, some SOB which pt voices is due to mask     Resting HR 78 bpm     Resting BP 144/80     Resting Oxygen Saturation  98 %     Exercise Oxygen Saturation  during 6 min walk 97 %     Max Ex. HR 109 bpm     Max Ex. BP 156/80     2 Minute Post BP 138/80            Oxygen Initial  Assessment:   Oxygen Re-Evaluation:   Oxygen Discharge (Final Oxygen Re-Evaluation):   Initial Exercise Prescription:  Initial Exercise Prescription - 11/28/19 1500      Date of Initial Exercise RX and Referring Provider   Date 11/28/19    Referring Provider Verdis Prime, MD    Expected Discharge Date 01/26/20      Treadmill   MPH 2.7    Grade 0    Minutes 15    METs 3.07      NuStep   Level 2    SPM 70    Minutes 15    METs 2      Prescription Details   Frequency (times per week) 3    Duration Progress to 30 minutes of continuous aerobic without signs/symptoms of physical distress      Intensity   THRR 40-80% of Max Heartrate 64-129    Ratings of Perceived Exertion 11-13    Perceived Dyspnea 0-4      Progression   Progression Continue progressive overload as per policy without signs/symptoms or physical distress.      Resistance Training   Training Prescription Yes    Weight 4 lbs    Reps 10-15           Perform Capillary Blood Glucose checks as needed.  Exercise Prescription Changes:   Exercise Comments:   Exercise Goals and Review:   Exercise Goals    Row Name 11/28/19 1528             Exercise Goals   Increase Physical Activity Yes       Intervention Provide advice, education, support and counseling about physical activity/exercise needs.;Develop an individualized exercise prescription for aerobic and resistive training based on initial evaluation findings, risk stratification, comorbidities and participant's personal goals.       Expected Outcomes Short Term: Attend rehab on a regular basis to increase amount of physical activity.;Long Term: Add in home exercise to make exercise part of routine and to increase amount of physical activity.;Long Term: Exercising regularly at least 3-5 days a week.  Increase Strength and Stamina Yes       Intervention Provide advice, education, support and counseling about physical activity/exercise  needs.;Develop an individualized exercise prescription for aerobic and resistive training based on initial evaluation findings, risk stratification, comorbidities and participant's personal goals.       Expected Outcomes Short Term: Increase workloads from initial exercise prescription for resistance, speed, and METs.;Short Term: Perform resistance training exercises routinely during rehab and add in resistance training at home;Long Term: Improve cardiorespiratory fitness, muscular endurance and strength as measured by increased METs and functional capacity ( )       Able to understand and use rate of perceived exertion (RPE) scale Yes       Intervention Provide education and explanation on how to use RPE scale       Expected Outcomes Short Term: Able to use RPE daily in rehab to express subjective intensity level;Long Term:  Able to use RPE to guide intensity level when exercising independently       Knowledge and understanding of Target Heart Rate Range (THRR) Yes       Intervention Provide education and explanation of THRR including how the numbers were predicted and where they are located for reference       Expected Outcomes Short Term: Able to state/look up THRR;Long Term: Able to use THRR to govern intensity when exercising independently;Short Term: Able to use daily as guideline for intensity in rehab       Able to check pulse independently Yes       Intervention Review the importance of being able to check your own pulse for safety during independent exercise;Provide education and demonstration on how to check pulse in carotid and radial arteries.       Expected Outcomes Short Term: Able to explain why pulse checking is important during independent exercise;Long Term: Able to check pulse independently and accurately       Understanding of Exercise Prescription Yes       Intervention Provide education, explanation, and written materials on patient's individual exercise prescription        Expected Outcomes Short Term: Able to explain program exercise prescription;Long Term: Able to explain home exercise prescription to exercise independently              Exercise Goals Re-Evaluation :    Discharge Exercise Prescription (Final Exercise Prescription Changes):   Nutrition:  Target Goals: Understanding of nutrition guidelines, daily intake of sodium 1500mg , cholesterol 200mg , calories 30% from fat and 7% or less from saturated fats, daily to have 5 or more servings of fruits and vegetables.  Biometrics:  Pre Biometrics - 11/28/19 1330      Pre Biometrics   Waist Circumference 41 inches    Hip Circumference 40 inches    Waist to Hip Ratio 1.03 %    Triceps Skinfold 13 mm    % Body Fat 28 %    Grip Strength 41 kg    Flexibility 14.75 in    Single Leg Stand 51.68 seconds            Nutrition Therapy Plan and Nutrition Goals:   Nutrition Assessments:   Nutrition Goals Re-Evaluation:   Nutrition Goals Discharge (Final Nutrition Goals Re-Evaluation):   Psychosocial: Target Goals: Acknowledge presence or absence of significant depression and/or stress, maximize coping skills, provide positive support system. Participant is able to verbalize types and ability to use techniques and skills needed for reducing stress and depression.  Initial Review & Psychosocial Screening:  Initial Psych Review & Screening - 11/28/19 1618      Initial Review   Current issues with Current Stress Concerns    Source of Stress Concerns Family    Comments unfortunately Mr Radliff's brother was shot and killed last week. The funeral was on 11/26/19      Family Dynamics   Good Support System? Yes   Harvie Heck has his wife for support     Barriers   Psychosocial barriers to participate in program The patient should benefit from training in stress management and relaxation.      Screening Interventions   Interventions Encouraged to exercise;To provide support and resources with  identified psychosocial needs    Expected Outcomes Short Term goal: Utilizing psychosocial counselor, staff and physician to assist with identification of specific Stressors or current issues interfering with healing process. Setting desired goal for each stressor or current issue identified.           Quality of Life Scores:  Quality of Life - 11/28/19 1539      Quality of Life   Select Quality of Life      Quality of Life Scores   Health/Function Pre 27.2 %    Socioeconomic Pre 27.43 %    Psych/Spiritual Pre 27.43 %    Family Pre 26.4 %    GLOBAL Pre 27.18 %          Scores of 19 and below usually indicate a poorer quality of life in these areas.  A difference of  2-3 points is a clinically meaningful difference.  A difference of 2-3 points in the total score of the Quality of Life Index has been associated with significant improvement in overall quality of life, self-image, physical symptoms, and general health in studies assessing change in quality of life.  PHQ-9: Recent Review Flowsheet Data    Depression screen [facility] 2/9 11/28/2019   Decreased Interest 0   Down, Depressed, Hopeless 0   PHQ - 2 Score 0     Interpretation of Total Score  Total Score Depression Severity:  1-4 = Minimal depression, 5-9 = Mild depression, 10-14 = Moderate depression, 15-19 = Moderately severe depression, 20-27 = Severe depression   Psychosocial Evaluation and Intervention:   Psychosocial Re-Evaluation:   Psychosocial Discharge (Final Psychosocial Re-Evaluation):   Vocational Rehabilitation: Provide vocational rehab assistance to qualifying candidates.   Vocational Rehab Evaluation & Intervention:  Vocational Rehab - 11/28/19 1621      Initial Vocational Rehab Evaluation & Intervention   Assessment shows need for Vocational Rehabilitation No   Harvie Heck is currently working full time and does not need vocational rehab at this time          Education: Education Goals: Education  classes will be provided on a weekly basis, covering required topics. Participant will state understanding/return demonstration of topics presented.  Learning Barriers/Preferences:  Learning Barriers/Preferences - 11/28/19 1540      Learning Barriers/Preferences   Learning Barriers Sight   Wears glasses   Learning Preferences Audio;Computer/Internet;Individual Instruction;Skilled Demonstration           Education Topics: Hypertension, Hypertension Reduction -Define heart disease and high blood pressure. Discus how high blood pressure affects the body and ways to reduce high blood pressure.   Exercise and Your Heart -Discuss why it is important to exercise, the FITT principles of exercise, normal and abnormal responses to exercise, and how to exercise safely.   Angina -Discuss definition of angina, causes of angina, treatment of angina, and  how to decrease risk of having angina.   Cardiac Medications -Review what the following cardiac medications are used for, how they affect the body, and side effects that may occur when taking the medications.  Medications include Aspirin, Beta blockers, calcium channel blockers, ACE Inhibitors, angiotensin receptor blockers, diuretics, digoxin, and antihyperlipidemics.   Congestive Heart Failure -Discuss the definition of CHF, how to live with CHF, the signs and symptoms of CHF, and how keep track of weight and sodium intake.   Heart Disease and Intimacy -Discus the effect sexual activity has on the heart, how changes occur during intimacy as we age, and safety during sexual activity.   Smoking Cessation / COPD -Discuss different methods to quit smoking, the health benefits of quitting smoking, and the definition of COPD.   Nutrition I: Fats -Discuss the types of cholesterol, what cholesterol does to the heart, and how cholesterol levels can be controlled.   Nutrition II: Labels -Discuss the different components of food labels and how  to read food label   Heart Parts/Heart Disease and PAD -Discuss the anatomy of the heart, the pathway of blood circulation through the heart, and these are affected by heart disease.   Stress I: Signs and Symptoms -Discuss the causes of stress, how stress may lead to anxiety and depression, and ways to limit stress.   Stress II: Relaxation -Discuss different types of relaxation techniques to limit stress.   Warning Signs of Stroke / TIA -Discuss definition of a stroke, what the signs and symptoms are of a stroke, and how to identify when someone is having stroke.   Knowledge Questionnaire Score:  Knowledge Questionnaire Score - 11/28/19 1543      Knowledge Questionnaire Score   Pre Score 22/24           Core Components/Risk Factors/Patient Goals at Admission:  Personal Goals and Risk Factors at Admission - 11/28/19 1621      Core Components/Risk Factors/Patient Goals on Admission    Weight Management Yes;Weight Maintenance    Intervention Weight Management: Develop a combined nutrition and exercise program designed to reach desired caloric intake, while maintaining appropriate intake of nutrient and fiber, sodium and fats, and appropriate energy expenditure required for the weight goal.;Weight Management: Provide education and appropriate resources to help participant work on and attain dietary goals.;Weight Management/Obesity: Establish reasonable short term and long term weight goals.    Admit Weight 204 lb 12.9 oz (92.9 kg)    Goal Weight: Long Term 200 lb (90.7 kg)    Expected Outcomes Short Term: Continue to assess and modify interventions until short term weight is achieved;Long Term: Adherence to nutrition and physical activity/exercise program aimed toward attainment of established weight goal;Weight Maintenance: Understanding of the daily nutrition guidelines, which includes 25-35% calories from fat, 7% or less cal from saturated fats, less than 200mg  cholesterol, less  than 1.5gm of sodium, & 5 or more servings of fruits and vegetables daily;Weight Loss: Understanding of general recommendations for a balanced deficit meal plan, which promotes 1-2 lb weight loss per week and includes a negative energy balance of [phone] kcal/d;Understanding recommendations for meals to include 15-35% energy as protein, 25-35% energy from fat, 35-60% energy from carbohydrates, less than 200mg  of dietary cholesterol, 20-35 gm of total fiber daily;Understanding of distribution of calorie intake throughout the day with the consumption of 4-5 meals/snacks    Hypertension Yes    Intervention Provide education on lifestyle modifcations including regular physical activity/exercise, weight management, moderate sodium restriction and increased  consumption of fresh fruit, vegetables, and low fat dairy, alcohol moderation, and smoking cessation.;Monitor prescription use compliance.    Expected Outcomes Short Term: Continued assessment and intervention until BP is < 140/29mm HG in hypertensive participants. < 130/51mm HG in hypertensive participants with diabetes, heart failure or chronic kidney disease.;Long Term: Maintenance of blood pressure at goal levels.    Lipids Yes    Intervention Provide education and support for participant on nutrition & aerobic/resistive exercise along with prescribed medications to achieve LDL 70mg , HDL >40mg .    Expected Outcomes Short Term: Participant states understanding of desired cholesterol values and is compliant with medications prescribed. Participant is following exercise prescription and nutrition guidelines.;Long Term: Cholesterol controlled with medications as prescribed, with individualized exercise RX and with personalized nutrition plan. Value goals: LDL < 70mg , HDL > 40 mg.    Stress Yes    Intervention Offer individual and/or small group education and counseling on adjustment to heart disease, stress management and health-related lifestyle change.  Teach and support self-help strategies.;Refer participants experiencing significant psychosocial distress to appropriate mental health specialists for further evaluation and treatment. When possible, include family members and significant others in education/counseling sessions.    Expected Outcomes Short Term: Participant demonstrates changes in health-related behavior, relaxation and other stress management skills, ability to obtain effective social support, and compliance with psychotropic medications if prescribed.;Long Term: Emotional wellbeing is indicated by absence of clinically significant psychosocial distress or social isolation.           Core Components/Risk Factors/Patient Goals Review:    Core Components/Risk Factors/Patient Goals at Discharge (Final Review):    ITP Comments:  ITP Comments    Row Name 11/28/19 1614           ITP Comments Dr 01/28/20 MD, Medical Director              Comments:Randy attended orientation on 11/28/2019 to review rules and guidelines for program.  Completed 6 minute walk test, Intitial ITP, and exercise prescription.  VSS. Telemetry-Sinus Rhythm. Randy did voice having slight shortness of breath due to wearing a mask otherwise,  Asymptomatic. Safety measures and social distancing in place per CDC guidelines.01/28/2020, RN,BSN 11/28/2019 4:29 PM

## 2019-12-04 ENCOUNTER — Encounter (HOSPITAL_COMMUNITY)
Admission: RE | Admit: 2019-12-04 | Discharge: 2019-12-04 | Disposition: A | Discharge status: Home / Self Care | Payer: 59 | Source: Ambulatory Visit | Attending: Interventional Cardiology | Admitting: Interventional Cardiology

## 2019-12-04 ENCOUNTER — Other Ambulatory Visit: Payer: Self-pay

## 2019-12-04 DIAGNOSIS — I2102 ST elevation (STEMI) myocardial infarction involving left anterior descending coronary artery: Secondary | ICD-10-CM

## 2019-12-04 NOTE — Progress Notes (Signed)
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  acetaminophen (TYLENOL) 325 MG tablet, Take 2 tablets (650 mg total) by mouth every 6 (six) hours as needed for mild pain., Disp: , Rfl:  .  aspirin EC 81 MG EC tablet, Take 1 tablet (81 mg total) by mouth daily., Disp: , Rfl:  .  atorvastatin (LIPITOR) 80 MG tablet, Take 1 tablet (80 mg total) by mouth daily., Disp: 90 tablet, Rfl: 3 .  carvedilol (COREG) 12.5 MG tablet, Take 1 tablet (12.5 mg total) by mouth 2 (two) times daily with a meal., Disp: 180 tablet, Rfl: 3 .  clopidogrel (PLAVIX) 75 MG tablet, Take 1 tablet (75 mg total) by mouth daily., Disp: 90 tablet, Rfl: 3 .  lisinopril (ZESTRIL) 2.5 MG tablet, Take 1 tablet (2.5 mg total) by mouth daily., Disp: 90 tablet, Rfl: 3  Past Medical History: Past Medical History:  Diagnosis Date  . Coronary artery disease   . GERD (gastroesophageal reflux disease)    "depending on what I eat."  . Hyperlipidemia   . Hypertension   . Myocardial infarction (HCC)    anterior Stemi  . PONV (postoperative nausea and vomiting)    ? anesthesia or Morphine    Tobacco Use: Social History   Tobacco Use  Smoking Status Former Smoker  . Years: 23.00  . Types: Cigarettes  . Quit date: 2001  . Years since quitting: 20.6  Smokeless Tobacco Never Used    Labs: Recent Review Flowsheet Data    Labs for ITP Cardiac and Pulmonary Rehab Latest Ref Rng & Units 09/19/2019 09/19/2019 09/20/2019 09/21/2019 10/14/2019    Cholestrol 0 - 200 mg/dL - - - - -   LDLCALC 0 - 99 mg/dL - - - - -   HDL >40 mg/dL - - - - -   Trlycerides <150 mg/dL - - - - -   Hemoglobin A1c 4.8 - 5.6 % - - - - -   PHART 7.35 - 7.45 7.330(L) 7.337(L) - - -   PCO2ART 32 - 48 mmHg 42.5 40.2 - - -   HCO3 20.0 - 28.0 mmol/L 22.4 21.5 - - -   TCO2 22 - 32 mmol/L 24 23 - - 25   ACIDBASEDEF 0.0 - 2.0 mmol/L 3.0(H) 4.0(H) - - -   O2SAT % 96.0 97.0 72.6 64.8 -      Capillary Blood Glucose: Lab Results  Component Value Date   GLUCAP 93 09/23/2019   GLUCAP 91 09/22/2019   GLUCAP 117 (H) 09/22/2019   GLUCAP 103 (H) 09/22/2019   GLUCAP 121 (H) 09/22/2019     Exercise Target Goals: Exercise Program Goal: Individual exercise prescription set using results from initial 6 min walk test and THRR while considering  patient's activity barriers and safety.   Exercise Prescription Goal: Initial exercise prescription builds to 30-45 minutes a day of aerobic activity, 2-3 days per week.  Home exercise guidelines will be given to patient during program as part of exercise prescription that  the participant will acknowledge.  Activity Barriers & Risk Stratification:  Activity Barriers & Cardiac Risk Stratification - 11/28/19 1528      Activity Barriers & Cardiac Risk Stratification   Activity Barriers None    Cardiac Risk Stratification Low           6 Minute Walk:  6 Minute Walk    Row Name 11/28/19 1430         6 Minute Walk   Phase Initial     Distance 1698 feet     Walk Time 6 minutes     # of Rest Breaks 0     MPH 3.21     METS 4.33     RPE 12     Perceived Dyspnea  1  Pt voices due to mask     VO2 Peak 15.1     Symptoms Yes (comment)     Comments RPD=1, some SOB which pt voices is due to mask     Resting HR 78 bpm     Resting BP 144/80     Resting Oxygen Saturation  98 %     Exercise Oxygen Saturation  during 6 min walk 97 %     Max Ex. HR 109 bpm     Max Ex. BP 156/80     2 Minute Post BP 138/80             Oxygen Initial Assessment:   Oxygen Re-Evaluation:   Oxygen Discharge (Final Oxygen Re-Evaluation):   Initial Exercise Prescription:  Initial Exercise Prescription - 11/28/19 1500      Date of Initial Exercise RX and Referring Provider   Date 11/28/19    Referring Provider Verdis PrimeSmith, Henry, MD    Expected Discharge Date 01/26/20      Treadmill   MPH 2.7    Grade 0    Minutes 15    METs 3.07      NuStep   Level 2    SPM 70    Minutes 15    METs 2      Prescription Details   Frequency (times per week) 3    Duration Progress to 30 minutes of continuous aerobic without signs/symptoms of physical distress      Intensity   THRR 40-80% of Max Heartrate 64-129    Ratings of Perceived Exertion 11-13    Perceived Dyspnea 0-4      Progression   Progression Continue progressive overload as per policy without signs/symptoms or physical distress.      Resistance Training   Training Prescription Yes    Weight 4 lbs    Reps 10-15           Perform Capillary Blood Glucose checks as needed.  Exercise Prescription Changes:   Exercise Prescription Changes    Row Name 12/04/19 1445             Response to Exercise   Blood Pressure (Admit) 122/76       Blood Pressure (Exercise) 142/76       Blood Pressure (Exit) 112/70       Heart Rate (Admit) 75 bpm       Heart Rate (Exercise) 102 bpm       Heart Rate (Exit) 79 bpm       Rating of Perceived Exertion (Exercise) 13       Symptoms None       Comments Pt's first day of exercise. Tolerated well.  Duration Progress to 30 minutes of  aerobic without signs/symptoms of physical distress       Intensity THRR unchanged         Progression   Progression Continue to progress workloads to maintain intensity without signs/symptoms of physical distress.       Average METs 2.64         Resistance Training   Training Prescription Yes       Weight 4 lbs       Reps 10-15       Time 10 Minutes         Interval  Training   Interval Training No         Treadmill   MPH 2.7       Grade 0       Minutes 15       METs 3.07         NuStep   Level 2       SPM 80       Minutes 15       METs 2.2              Exercise Comments:   Exercise Comments    Row Name 12/04/19 1445           Exercise Comments Pt's first day of exericse. Tolerated well with no complaints.              Exercise Goals and Review:   Exercise Goals    Row Name 11/28/19 1528             Exercise Goals   Increase Physical Activity Yes       Intervention Provide advice, education, support and counseling about physical activity/exercise needs.;Develop an individualized exercise prescription for aerobic and resistive training based on initial evaluation findings, risk stratification, comorbidities and participant's personal goals.       Expected Outcomes Short Term: Attend rehab on a regular basis to increase amount of physical activity.;Long Term: Add in home exercise to make exercise part of routine and to increase amount of physical activity.;Long Term: Exercising regularly at least 3-5 days a week.       Increase Strength and Stamina Yes       Intervention Provide advice, education, support and counseling about physical activity/exercise needs.;Develop an individualized exercise prescription for aerobic and resistive training based on initial evaluation findings, risk stratification, comorbidities and participant's personal goals.       Expected Outcomes Short Term: Increase workloads from initial exercise prescription for resistance, speed, and METs.;Short Term: Perform resistance training exercises routinely during rehab and add in resistance training at home;Long Term: Improve cardiorespiratory fitness, muscular endurance and strength as measured by increased METs and functional capacity ( )       Able to understand and use rate of perceived exertion (RPE) scale Yes       Intervention Provide education and  explanation on how to use RPE scale       Expected Outcomes Short Term: Able to use RPE daily in rehab to express subjective intensity level;Long Term:  Able to use RPE to guide intensity level when exercising independently       Knowledge and understanding of Target Heart Rate Range (THRR) Yes       Intervention Provide education and explanation of THRR including how the numbers were predicted and where they are located for reference       Expected Outcomes Short Term: Able to state/look up THRR;Long Term:  Able to use THRR to govern intensity when exercising independently;Short Term: Able to use daily as guideline for intensity in rehab       Able to check pulse independently Yes       Intervention Review the importance of being able to check your own pulse for safety during independent exercise;Provide education and demonstration on how to check pulse in carotid and radial arteries.       Expected Outcomes Short Term: Able to explain why pulse checking is important during independent exercise;Long Term: Able to check pulse independently and accurately       Understanding of Exercise Prescription Yes       Intervention Provide education, explanation, and written materials on patient's individual exercise prescription       Expected Outcomes Short Term: Able to explain program exercise prescription;Long Term: Able to explain home exercise prescription to exercise independently              Exercise Goals Re-Evaluation :  Exercise Goals Re-Evaluation    Row Name 12/04/19 1445 12/05/19 0746           Exercise Goal Re-Evaluation   Exercise Goals Review Increase Physical Activity;Increase Strength and Stamina;Able to understand and use rate of perceived exertion (RPE) scale;Knowledge and understanding of Target Heart Rate Range (THRR);Able to check pulse independently;Understanding of Exercise Prescription --      Comments Pt's first day of exercise the CRP2 program. Pt tolerated session well  with no complaints and understnads Exercise Rx, RPE scale, and THRR. --      Expected Outcomes Will continue to monitor patient ans progress as tolerated. --             Discharge Exercise Prescription (Final Exercise Prescription Changes):  Exercise Prescription Changes - 12/04/19 1445      Response to Exercise   Blood Pressure (Admit) 122/76    Blood Pressure (Exercise) 142/76    Blood Pressure (Exit) 112/70    Heart Rate (Admit) 75 bpm    Heart Rate (Exercise) 102 bpm    Heart Rate (Exit) 79 bpm    Rating of Perceived Exertion (Exercise) 13    Symptoms None    Comments Pt's first day of exercise. Tolerated well.    Duration Progress to 30 minutes of  aerobic without signs/symptoms of physical distress    Intensity THRR unchanged      Progression   Progression Continue to progress workloads to maintain intensity without signs/symptoms of physical distress.    Average METs 2.64      Resistance Training   Training Prescription Yes    Weight 4 lbs    Reps 10-15    Time 10 Minutes      Interval Training   Interval Training No      Treadmill   MPH 2.7    Grade 0    Minutes 15    METs 3.07      NuStep   Level 2    SPM 80    Minutes 15    METs 2.2           Nutrition:  Target Goals: Understanding of nutrition guidelines, daily intake of sodium 1500mg , cholesterol 200mg , calories 30% from fat and 7% or less from saturated fats, daily to have 5 or more servings of fruits and vegetables.  Biometrics:  Pre Biometrics - 11/28/19 1330      Pre Biometrics   Waist Circumference 41 inches    Hip Circumference 40 inches  Waist to Hip Ratio 1.03 %    Triceps Skinfold 13 mm    % Body Fat 28 %    Grip Strength 41 kg    Flexibility 14.75 in    Single Leg Stand 51.68 seconds            Nutrition Therapy Plan and Nutrition Goals:  Nutrition Therapy & Goals - 12/06/19 1436      Nutrition Therapy   Diet Heart Healthy      Personal Nutrition Goals    Nutrition Goal Pt to understand how diet impacts his CBGs and lipid panel    Personal Goal #2 Pt to build a healthy plate including vegetables, fruits, whole grains, and low-fat dairy products in a heart healthy meal plan.      Intervention Plan   Intervention Nutrition handout(s) given to patient.;Prescribe, educate and counsel regarding individualized specific dietary modifications aiming towards targeted core components such as weight, hypertension, lipid management, diabetes, heart failure and other comorbidities.    Expected Outcomes Short Term Goal: A plan has been developed with personal nutrition goals set during dietitian appointment.;Long Term Goal: Adherence to prescribed nutrition plan.           Nutrition Assessments:  Nutrition Assessments - 12/06/19 1443      MEDFICTS Scores   Pre Score 22           Nutrition Goals Re-Evaluation:  Nutrition Goals Re-Evaluation    Row Name 12/06/19 1443             Goals   Current Weight 204 lb (92.5 kg)       Nutrition Goal Pt to understand how diet impacts his CBGs and lipid panel         Personal Goal #2 Re-Evaluation   Personal Goal #2 Pt to build a healthy plate including vegetables, fruits, whole grains, and low-fat dairy products in a heart healthy meal plan.              Nutrition Goals Re-Evaluation:  Nutrition Goals Re-Evaluation    Row Name 12/06/19 1443             Goals   Current Weight 204 lb (92.5 kg)       Nutrition Goal Pt to understand how diet impacts his CBGs and lipid panel         Personal Goal #2 Re-Evaluation   Personal Goal #2 Pt to build a healthy plate including vegetables, fruits, whole grains, and low-fat dairy products in a heart healthy meal plan.              Nutrition Goals Discharge (Final Nutrition Goals Re-Evaluation):  Nutrition Goals Re-Evaluation - 12/06/19 1443      Goals   Current Weight 204 lb (92.5 kg)    Nutrition Goal Pt to understand how diet impacts his CBGs  and lipid panel      Personal Goal #2 Re-Evaluation   Personal Goal #2 Pt to build a healthy plate including vegetables, fruits, whole grains, and low-fat dairy products in a heart healthy meal plan.           Psychosocial: Target Goals: Acknowledge presence or absence of significant depression and/or stress, maximize coping skills, provide positive support system. Participant is able to verbalize types and ability to use techniques and skills needed for reducing stress and depression.  Initial Review & Psychosocial Screening:  Initial Psych Review & Screening - 11/28/19 1618      Initial Review  Current issues with Current Stress Concerns    Source of Stress Concerns Family    Comments unfortunately Mr Hankinson's brother was shot and killed last week. The funeral was on 11/26/19      Family Dynamics   Good Support System? Yes   Harvie Heck has his wife for support     Barriers   Psychosocial barriers to participate in program The patient should benefit from training in stress management and relaxation.      Screening Interventions   Interventions Encouraged to exercise;To provide support and resources with identified psychosocial needs    Expected Outcomes Short Term goal: Utilizing psychosocial counselor, staff and physician to assist with identification of specific Stressors or current issues interfering with healing process. Setting desired goal for each stressor or current issue identified.           Quality of Life Scores:  Quality of Life - 11/28/19 1539      Quality of Life   Select Quality of Life      Quality of Life Scores   Health/Function Pre 27.2 %    Socioeconomic Pre 27.43 %    Psych/Spiritual Pre 27.43 %    Family Pre 26.4 %    GLOBAL Pre 27.18 %          Scores of 19 and below usually indicate a poorer quality of life in these areas.  A difference of  2-3 points is a clinically meaningful difference.  A difference of 2-3 points in the total score of the Quality  of Life Index has been associated with significant improvement in overall quality of life, self-image, physical symptoms, and general health in studies assessing change in quality of life.  PHQ-9: Recent Review Flowsheet Data    Depression screen [facility] 2/9 11/28/2019   Decreased Interest 0   Down, Depressed, Hopeless 0   PHQ - 2 Score 0     Interpretation of Total Score  Total Score Depression Severity:  1-4 = Minimal depression, 5-9 = Mild depression, 10-14 = Moderate depression, 15-19 = Moderately severe depression, 20-27 = Severe depression   Psychosocial Evaluation and Intervention:  Psychosocial Evaluation - 12/04/19 1631      Psychosocial Evaluation & Interventions   Interventions Encouraged to exercise with the program and follow exercise prescription;Stress management education    Comments Mr. Dutt presents to his first cardiac rehab exercise session with a positive attitude and outlook. He denies psychosocial barriers to self health managment. He does admit to stress related to the recent death of his brother. He utilizes fishing, yard work, walking with spouse, and swimming as stress reducers. He has a good support system of family and friends. No interventions needed at this time however patient is encouraged to participate in CR and utilize hobbies for stress reduction.    Expected Outcomes Mr. Julson will continue to have a positive outlook and attitude. He will utilize his hobbies and support system to cope with stress as it arises.    Continue Psychosocial Services  Follow up required by staff           Psychosocial Re-Evaluation:   Psychosocial Discharge (Final Psychosocial Re-Evaluation):   Vocational Rehabilitation: Provide vocational rehab assistance to qualifying candidates.   Vocational Rehab Evaluation & Intervention:  Vocational Rehab - 11/28/19 1621      Initial Vocational Rehab Evaluation & Intervention   Assessment shows need for Vocational Rehabilitation No    Harvie Heck is currently working full time and does not need vocational  rehab at this time          Education: Education Goals: Education classes will be provided on a weekly basis, covering required topics. Participant will state understanding/return demonstration of topics presented.  Learning Barriers/Preferences:  Learning Barriers/Preferences - 11/28/19 1540      Learning Barriers/Preferences   Learning Barriers Sight   Wears glasses   Learning Preferences Audio;Computer/Internet;Individual Instruction;Skilled Demonstration           Education Topics: Count Your Pulse:  -Group instruction provided by verbal instruction, demonstration, patient participation and written materials to support subject.  Instructors address importance of being able to find your pulse and how to count your pulse when at home without a heart monitor.  Patients get hands on experience counting their pulse with staff help and individually.   Heart Attack, Angina, and Risk Factor Modification:  -Group instruction provided by verbal instruction, video, and written materials to support subject.  Instructors address signs and symptoms of angina and heart attacks.    Also discuss risk factors for heart disease and how to make changes to improve heart health risk factors.   Functional Fitness:  -Group instruction provided by verbal instruction, demonstration, patient participation, and written materials to support subject.  Instructors address safety measures for doing things around the house.  Discuss how to get up and down off the floor, how to pick things up properly, how to safely get out of a chair without assistance, and balance training.   Meditation and Mindfulness:  -Group instruction provided by verbal instruction, patient participation, and written materials to support subject.  Instructor addresses importance of mindfulness and meditation practice to help reduce stress and improve awareness.  Instructor  also leads participants through a meditation exercise.    Stretching for Flexibility and Mobility:  -Group instruction provided by verbal instruction, patient participation, and written materials to support subject.  Instructors lead participants through series of stretches that are designed to increase flexibility thus improving mobility.  These stretches are additional exercise for major muscle groups that are typically performed during regular warm up and cool down.   Hands Only CPR:  -Group verbal, video, and participation provides a basic overview of AHA guidelines for community CPR. Role-play of emergencies allow participants the opportunity to practice calling for help and chest compression technique with discussion of AED use.   Hypertension: -Group verbal and written instruction that provides a basic overview of hypertension including the most recent diagnostic guidelines, risk factor reduction with self-care instructions and medication management.    Nutrition I class: Heart Healthy Eating:  -Group instruction provided by PowerPoint slides, verbal discussion, and written materials to support subject matter. The instructor gives an explanation and review of the Therapeutic Lifestyle Changes diet recommendations, which includes a discussion on lipid goals, dietary fat, sodium, fiber, plant stanol/sterol esters, sugar, and the components of a well-balanced, healthy diet.   Nutrition II class: Lifestyle Skills:  -Group instruction provided by PowerPoint slides, verbal discussion, and written materials to support subject matter. The instructor gives an explanation and review of label reading, grocery shopping for heart health, heart healthy recipe modifications, and ways to make healthier choices when eating out.   Diabetes Question & Answer:  -Group instruction provided by PowerPoint slides, verbal discussion, and written materials to support subject matter. The instructor gives an  explanation and review of diabetes co-morbidities, pre- and post-prandial blood glucose goals, pre-exercise blood glucose goals, signs, symptoms, and treatment of hypoglycemia and hyperglycemia, and foot care basics.  Diabetes Blitz:  -Group instruction provided by PowerPoint slides, verbal discussion, and written materials to support subject matter. The instructor gives an explanation and review of the physiology behind type 1 and type 2 diabetes, diabetes medications and rational behind using different medications, pre- and post-prandial blood glucose recommendations and Hemoglobin A1c goals, diabetes diet, and exercise including blood glucose guidelines for exercising safely.    Portion Distortion:  -Group instruction provided by PowerPoint slides, verbal discussion, written materials, and food models to support subject matter. The instructor gives an explanation of serving size versus portion size, changes in portions sizes over the last 20 years, and what consists of a serving from each food group.   Stress Management:  -Group instruction provided by verbal instruction, video, and written materials to support subject matter.  Instructors review role of stress in heart disease and how to cope with stress positively.     Exercising on Your Own:  -Group instruction provided by verbal instruction, power point, and written materials to support subject.  Instructors discuss benefits of exercise, components of exercise, frequency and intensity of exercise, and end points for exercise.  Also discuss use of nitroglycerin and activating EMS.  Review options of places to exercise outside of rehab.  Review guidelines for sex with heart disease.   Cardiac Drugs I:  -Group instruction provided by verbal instruction and written materials to support subject.  Instructor reviews cardiac drug classes: antiplatelets, anticoagulants, beta blockers, and statins.  Instructor discusses reasons, side effects, and  lifestyle considerations for each drug class.   Cardiac Drugs II:  -Group instruction provided by verbal instruction and written materials to support subject.  Instructor reviews cardiac drug classes: angiotensin converting enzyme inhibitors (ACE-I), angiotensin II receptor blockers (ARBs), nitrates, and calcium channel blockers.  Instructor discusses reasons, side effects, and lifestyle considerations for each drug class.   Anatomy and Physiology of the Circulatory System:  Group verbal and written instruction and models provide basic cardiac anatomy and physiology, with the coronary electrical and arterial systems. Review of: AMI, Angina, Valve disease, Heart Failure, Peripheral Artery Disease, Cardiac Arrhythmia, Pacemakers, and the ICD.   Other Education:  -Group or individual verbal, written, or video instructions that support the educational goals of the cardiac rehab program.   Holiday Eating Survival Tips:  -Group instruction provided by PowerPoint slides, verbal discussion, and written materials to support subject matter. The instructor gives patients tips, tricks, and techniques to help them not only survive but enjoy the holidays despite the onslaught of food that accompanies the holidays.   Knowledge Questionnaire Score:  Knowledge Questionnaire Score - 11/28/19 1543      Knowledge Questionnaire Score   Pre Score 22/24           Core Components/Risk Factors/Patient Goals at Admission:  Personal Goals and Risk Factors at Admission - 11/28/19 1621      Core Components/Risk Factors/Patient Goals on Admission    Weight Management Yes;Weight Maintenance    Intervention Weight Management: Develop a combined nutrition and exercise program designed to reach desired caloric intake, while maintaining appropriate intake of nutrient and fiber, sodium and fats, and appropriate energy expenditure required for the weight goal.;Weight Management: Provide education and appropriate  resources to help participant work on and attain dietary goals.;Weight Management/Obesity: Establish reasonable short term and long term weight goals.    Admit Weight 204 lb 12.9 oz (92.9 kg)    Goal Weight: Long Term 200 lb (90.7 kg)    Expected Outcomes Short Term:  Continue to assess and modify interventions until short term weight is achieved;Long Term: Adherence to nutrition and physical activity/exercise program aimed toward attainment of established weight goal;Weight Maintenance: Understanding of the daily nutrition guidelines, which includes 25-35% calories from fat, 7% or less cal from saturated fats, less than 200mg  cholesterol, less than 1.5gm of sodium, & 5 or more servings of fruits and vegetables daily;Weight Loss: Understanding of general recommendations for a balanced deficit meal plan, which promotes 1-2 lb weight loss per week and includes a negative energy balance of [phone] kcal/d;Understanding recommendations for meals to include 15-35% energy as protein, 25-35% energy from fat, 35-60% energy from carbohydrates, less than 200mg  of dietary cholesterol, 20-35 gm of total fiber daily;Understanding of distribution of calorie intake throughout the day with the consumption of 4-5 meals/snacks    Hypertension Yes    Intervention Provide education on lifestyle modifcations including regular physical activity/exercise, weight management, moderate sodium restriction and increased consumption of fresh fruit, vegetables, and low fat dairy, alcohol moderation, and smoking cessation.;Monitor prescription use compliance.    Expected Outcomes Short Term: Continued assessment and intervention until BP is < 140/50mm HG in hypertensive participants. < 130/71mm HG in hypertensive participants with diabetes, heart failure or chronic kidney disease.;Long Term: Maintenance of blood pressure at goal levels.    Lipids Yes    Intervention Provide education and support for participant on nutrition &  aerobic/resistive exercise along with prescribed medications to achieve LDL 70mg , HDL >40mg .    Expected Outcomes Short Term: Participant states understanding of desired cholesterol values and is compliant with medications prescribed. Participant is following exercise prescription and nutrition guidelines.;Long Term: Cholesterol controlled with medications as prescribed, with individualized exercise RX and with personalized nutrition plan. Value goals: LDL < 70mg , HDL > 40 mg.    Stress Yes    Intervention Offer individual and/or small group education and counseling on adjustment to heart disease, stress management and health-related lifestyle change. Teach and support self-help strategies.;Refer participants experiencing significant psychosocial distress to appropriate mental health specialists for further evaluation and treatment. When possible, include family members and significant others in education/counseling sessions.    Expected Outcomes Short Term: Participant demonstrates changes in health-related behavior, relaxation and other stress management skills, ability to obtain effective social support, and compliance with psychotropic medications if prescribed.;Long Term: Emotional wellbeing is indicated by absence of clinically significant psychosocial distress or social isolation.           Core Components/Risk Factors/Patient Goals Review:   Goals and Risk Factor Review    Row Name 12/04/19 1634             Core Components/Risk Factors/Patient Goals Review   Personal Goals Review Weight Management/Obesity;Lipids;Stress;Hypertension       Review Mr. Linarez has multiple CAD risk factors. He is eager to participate in CR for risk factor reduction. His goals are to increase his stamina and strength.       Expected Outcomes Patient will continue to participate in CR for risk factor reduction and education.              Core Components/Risk Factors/Patient Goals at Discharge (Final  Review):   Goals and Risk Factor Review - 12/04/19 1634      Core Components/Risk Factors/Patient Goals Review   Personal Goals Review Weight Management/Obesity;Lipids;Stress;Hypertension    Review Mr. Soward has multiple CAD risk factors. He is eager to participate in CR for risk factor reduction. His goals are to increase his stamina and strength.  Expected Outcomes Patient will continue to participate in CR for risk factor reduction and education.           ITP Comments:  ITP Comments    Row Name 11/28/19 1614 12/04/19 1628         ITP Comments Dr Armanda Magic MD, Medical Director 30 day ITP review: Mr. Kappes completed his first cardiac rehab exercise session today and tolerated well. Denied complaints. VSS. Eager to participate in CR for risk factor modifications. Goals are to maintain current weight and increase his stamina and strength. He hopes to return to his previous state of activity level prior to cardiac event. Will continue to monitor progression towards goals over the next 30 days.             Comments: see ITP comments

## 2019-12-04 NOTE — Progress Notes (Signed)
Daily Session Note  Patient Details  Name: [name] MRN: [id number] Date of Birth: [birth year] Referring Provider:     CARDIAC REHAB PHASE II ORIENTATION from 11/28/2019 in [facility]  Referring Provider Daneen Schick, MD      Encounter Date: 12/04/2019  Check In:  Session Check In - 12/04/19 1330      Check-In   Supervising physician immediately available to respond to emergencies [facility] immediately available    Physician(s) Dr. Kurtis Bushman    Location [facility]    Staff Present Dorma Russell, MS,ACSM CEP, Exercise Physiologist;Deidra Spease Rollene Rotunda, RN, Marga Melnick, RN, Deland Pretty, MS, ACSM CEP, Exercise Physiologist;David Makemson, MS, EP-C, CCRP    Virtual Visit No    Medication changes reported     No    Fall or balance concerns reported    No    Tobacco Cessation No Change    Warm-up and Cool-down Performed on first and last piece of equipment    Resistance Training Performed Yes    VAD Patient? No    PAD/SET Patient? No      Pain Assessment   Currently in Pain? No/denies    Pain Score 0-No pain    Multiple Pain Sites No           Capillary Blood Glucose: No results found for this or any previous visit (from the past 24 hour(s)).    Social History   Tobacco Use  Smoking Status Former Smoker  . Years: 23.00  . Types: Cigarettes  . Quit date: 2001  . Years since quitting: 20.6  Smokeless Tobacco Never Used    Goals Met:  Exercise tolerated well Personal goals reviewed No report of cardiac concerns or symptoms Strength training completed today  Goals Unmet:  Not Applicable  Comments: Pt started cardiac rehab today.  Pt tolerated light exercise without difficulty. VSS, telemetry-NSR, asymptomatic.  Medication list reconciled. Pt denies barriers to medicaiton compliance.  PSYCHOSOCIAL ASSESSMENT:  PHQ-0. Pt exhibits positive coping skills, hopeful outlook with supportive family. No psychosocial  needs identified at this time, no psychosocial interventions necessary. Pt oriented to exercise equipment and routine. Understanding verbalized.   Dr. Fransico Him is Medical Director for Cardiac Rehab at [facility].

## 2019-12-06 ENCOUNTER — Other Ambulatory Visit: Payer: Self-pay

## 2019-12-06 ENCOUNTER — Encounter (HOSPITAL_COMMUNITY)
Admission: RE | Admit: 2019-12-06 | Discharge: 2019-12-06 | Disposition: A | Discharge status: Home / Self Care | Payer: 59 | Source: Ambulatory Visit | Attending: Interventional Cardiology | Admitting: Interventional Cardiology

## 2019-12-06 DIAGNOSIS — I2102 ST elevation (STEMI) myocardial infarction involving left anterior descending coronary artery: Secondary | ICD-10-CM | POA: Diagnosis not present

## 2019-12-06 NOTE — Progress Notes (Signed)
Yoel Kaufhold Titzer 59 y.o. male Nutrition Note  Visit Diagnosis: 09/19/19 STEMI, CABGx2  Past Medical History:  Diagnosis Date  . Coronary artery disease   . GERD (gastroesophageal reflux disease)    "depending on what I eat."  . Hyperlipidemia   . Hypertension   . Myocardial infarction (HCC)    anterior Stemi  . PONV (postoperative nausea and vomiting)    ? anesthesia or Morphine     Medications reviewed.   Current Outpatient Medications:  .  acetaminophen (TYLENOL) 325 MG tablet, Take 2 tablets (650 mg total) by mouth every 6 (six) hours as needed for mild pain., Disp: , Rfl:  .  aspirin EC 81 MG EC tablet, Take 1 tablet (81 mg total) by mouth daily., Disp: , Rfl:  .  atorvastatin (LIPITOR) 80 MG tablet, Take 1 tablet (80 mg total) by mouth daily., Disp: 90 tablet, Rfl: 3 .  carvedilol (COREG) 12.5 MG tablet, Take 1 tablet (12.5 mg total) by mouth 2 (two) times daily with a meal., Disp: 180 tablet, Rfl: 3 .  clopidogrel (PLAVIX) 75 MG tablet, Take 1 tablet (75 mg total) by mouth daily., Disp: 90 tablet, Rfl: 3 .  lisinopril (ZESTRIL) 2.5 MG tablet, Take 1 tablet (2.5 mg total) by mouth daily., Disp: 90 tablet, Rfl: 3   Ht Readings from Last 1 Encounters:  11/28/19 5\' 9"  (1.753 m)     Wt Readings from Last 3 Encounters:  11/28/19 204 lb 12.9 oz (92.9 kg)  11/03/19 207 lb (93.9 kg)  10/19/19 211 lb (95.7 kg)     There is no height or weight on file to calculate BMI.   Social History   Tobacco Use  Smoking Status Former Smoker  . Years: 23.00  . Types: Cigarettes  . Quit date: 2001  . Years since quitting: 20.6  Smokeless Tobacco Never Used     Lab Results  Component Value Date   CHOL 203 (H) 09/19/2019   Lab Results  Component Value Date   HDL 43 09/19/2019   Lab Results  Component Value Date   LDLCALC 131 (H) 09/19/2019   Lab Results  Component Value Date   TRIG 146 09/19/2019     Lab Results  Component Value Date   HGBA1C 6.3 (H) 09/19/2019      CBG (last 3)  No results for input(s): GLUCAP in the last 72 hours.   Nutrition Note  Spoke with pt. Nutrition Plan and Nutrition Survey goals reviewed with pt. Pt is following a Heart Healthy diet. Pt lost 20 lbs after his CABG. Pt has Pre-diabetes. Last A1c indicates blood glucose well-controlled. Reviewed this number and what that means.  Per discussion, pt does not use canned/convenience foods often. Pt does not add salt to food. Pt does not eat out frequently.   Pt expressed understanding of the information reviewed.    Nutrition Diagnosis ? Inappropriate intake of saturated fats related to frequent eating out prior to CABG as evidenced by pt's LDL value of 131  Nutrition Intervention ? Pt's individual nutrition plan reviewed with pt. ? Benefits of adopting Heart Healthy diet discussed when Medficts reviewed.Continue client-centered nutrition education by RD, as part of interdisciplinary care.  Goal(s) ? Pt to understand how diet impacts his CBGs and lipid panel ? Pt to build a healthy plate including vegetables, fruits, whole grains, and low-fat dairy products in a heart healthy meal plan.  Plan:  Will provide client-centered nutrition education as part of interdisciplinary care  Monitor  and evaluate progress toward nutrition goal with team.   Andrey Campanile, MS, RDN, LDN

## 2019-12-08 ENCOUNTER — Other Ambulatory Visit: Payer: Self-pay

## 2019-12-08 ENCOUNTER — Encounter (HOSPITAL_COMMUNITY)
Admission: RE | Admit: 2019-12-08 | Discharge: 2019-12-08 | Disposition: A | Discharge status: Home / Self Care | Payer: 59 | Source: Ambulatory Visit | Attending: Interventional Cardiology | Admitting: Interventional Cardiology

## 2019-12-08 DIAGNOSIS — I2102 ST elevation (STEMI) myocardial infarction involving left anterior descending coronary artery: Secondary | ICD-10-CM | POA: Diagnosis not present

## 2019-12-11 ENCOUNTER — Encounter (HOSPITAL_COMMUNITY): Payer: 59

## 2019-12-12 NOTE — Progress Notes (Signed)
Cardiology Office Note:    Date:  12/13/2019   ID:  [name], DOB 10/31/1960, MRN [id number]  PCP:  Patient, No Pcp Per  Cardiologist:  Lesleigh Noe, MD   Referring MD: No ref. provider found   Chief Complaint  Patient presents with  . Coronary Artery Disease  . Congestive Heart Failure    History of Present Illness:    [name] is a 59 y.o. male with a hx of anterior STEMI and underwent emergency CABG with LIMA to LAD and saphenous vein graft to diagonal by Dr. Sheliah Plane on Sep 19, 2019.Marland Kitchen Other medical problems include hypertension, hyperlipidemia, and obesity.  [name] presented with acute coronary syndrome on Sep 19, 2019 and had immediate coronary artery bypass surgery because of severe proximal LAD diagonal complex disease I felt would be best treated with surgery rather than stenting.  He had LIMA LAD and SVG to diagonal as noted above.  He had postsurgical complication of occluded right radial from which the catheterization was performed.  He had right hand resting ischemia as an outpatient.  This was repaired by Dr. Lemar Livings.  His hand has been great since that time.  Pallor and numbness has resolved.  He has had some waxing and waning mood related to surgery but then complicated by the fact that his younger brother was murdered in a altercation at work.  He has been quite depressed and is now having to go through legal proceedings related to the brother's death.  He is trying as much as he can to help his sister-in-law who is devastated.  Past Medical History:  Diagnosis Date  . Coronary artery disease   . GERD (gastroesophageal reflux disease)    "depending on what I eat."  . Hyperlipidemia   . Hypertension   . Myocardial infarction (HCC)    anterior Stemi  . PONV (postoperative nausea and vomiting)    ? anesthesia or Morphine    Past Surgical History:  Procedure Laterality Date  . CARDIAC CATHETERIZATION    . CORONARY ARTERY BYPASS GRAFT N/A  09/19/2019   Procedure: CORONARY ARTERY BYPASS GRAFTING (CABG) using LIMA to LAD; Endoscopic Right Greater Saphenous Vein Harvest: SVG to Diag.;  Surgeon: Delight Ovens, MD;  Location: [facility] OR;  Service: Open Heart Surgery;  Laterality: N/A;  . CORONARY BALLOON ANGIOPLASTY N/A 09/19/2019   Procedure: CORONARY BALLOON ANGIOPLASTY;  Surgeon: Lyn Records, MD;  Location: [facility] INVASIVE [facility] LAB;  Service: Cardiovascular;  Laterality: N/A;  . ENDOVEIN HARVEST OF GREATER SAPHENOUS VEIN Right 09/19/2019   Procedure: Mack Guise Of Greater Saphenous Vein;  Surgeon: Delight Ovens, MD;  Location: [facility] OR;  Service: Open Heart Surgery;  Laterality: Right;  . IABP INSERTION N/A 09/19/2019   Procedure: IABP Insertion;  Surgeon: Lyn Records, MD;  Location: [facility] INVASIVE CV LAB;  Service: Cardiovascular;  Laterality: N/A;  . LEFT HEART CATH AND CORONARY ANGIOGRAPHY N/A 09/19/2019   Procedure: LEFT HEART CATH AND CORONARY ANGIOGRAPHY;  Surgeon: Lyn Records, MD;  Location: [facility] LAB;  Service: Cardiovascular;  Laterality: N/A;  . TEE WITHOUT CARDIOVERSION N/A 09/19/2019   Procedure: TRANSESOPHAGEAL ECHOCARDIOGRAM (TEE);  Surgeon: Delight Ovens, MD;  Location: [facility] OR;  Service: Open Heart Surgery;  Laterality: N/A;  . THROMBECTOMY BRACHIAL ARTERY Right 10/14/2019   Procedure: THROMBECTOMY RIGHT RADIAL ARTERY;  Surgeon: Maeola Harman, MD;  Location: [facility] OR;  Service: Vascular;  Laterality: Right;    Current Medications:  Current Meds  Medication Sig  . acetaminophen (TYLENOL) 325 MG tablet Take 2 tablets (650 mg total) by mouth every 6 (six) hours as needed for mild pain.  Marland Kitchen aspirin EC 81 MG EC tablet Take 1 tablet (81 mg total) by mouth daily.  Marland Kitchen atorvastatin (LIPITOR) 80 MG tablet Take 1 tablet (80 mg total) by mouth daily.  . carvedilol (COREG) 12.5 MG tablet Take 1 tablet (12.5 mg total) by mouth 2 (two) times daily with a meal.  . clopidogrel (PLAVIX) 75 MG tablet Take 1 tablet  (75 mg total) by mouth daily.  Marland Kitchen lisinopril (ZESTRIL) 2.5 MG tablet Take 1 tablet (2.5 mg total) by mouth daily.     Allergies:   Morphine and related   Social History   Socioeconomic History  . Marital status: Married    Spouse name: Not on file  . Number of children: Not on file  . Years of education: 27  . Highest education level: High school graduate  Occupational History  . Not on file  Tobacco Use  . Smoking status: Former Smoker    Years: 23.00    Types: Cigarettes    Quit date: 2001    Years since quitting: 20.6  . Smokeless tobacco: Never Used  Vaping Use  . Vaping Use: Never used  Substance and Sexual Activity  . Alcohol use: Yes    Comment: occasional  . Drug use: Never  . Sexual activity: Not on file  Other Topics Concern  . Not on file  Social History Narrative  . Not on file   Social Determinants of Health   Financial Resource Strain:   . Difficulty of Paying Living Expenses:   Food Insecurity:   . Worried About Programme researcher, broadcasting/film/video in the Last Year:   . Barista in the Last Year:   Transportation Needs:   . Freight forwarder (Medical):   Marland Kitchen Lack of Transportation (Non-Medical):   Physical Activity:   . Days of Exercise per Week:   . Minutes of Exercise per Session:   Stress:   . Feeling of Stress :   Social Connections:   . Frequency of Communication with Friends and Family:   . Frequency of Social Gatherings with Friends and Family:   . Attends Religious Services:   . Active Member of Clubs or Organizations:   . Attends Banker Meetings:   Marland Kitchen Marital Status:      Family History: The patient's family history is not on file.  ROS:   Please see the history of present illness.    Some depression.  No difficulty with the medications as prescribed.  He is interested in whether or not he needs any follow-up blood work.  No blood work is been done since discharge from the hospital.  All other systems reviewed and are  negative.  EKGs/Labs/Other Studies Reviewed:    The following studies were reviewed today: No repeat imaging data.  Echocardiogram intraoperatively demonstrated an EF of 40%.  Estimated LVEF at the time of cath was 45%.  EKG:  EKG not repeated today.  Recent Labs: 09/20/2019: Magnesium 2.5 09/23/2019: Platelets 187 10/14/2019: BUN 14; Creatinine, Ser 0.90; Hemoglobin 13.6; Potassium 4.0; Sodium 138  Recent Lipid Panel    Component Value Date/Time   CHOL 203 (H) 09/19/2019 0644   TRIG 146 09/19/2019 0644   HDL 43 09/19/2019 0644   CHOLHDL 4.7 09/19/2019 0644   VLDL 29 09/19/2019 0644   LDLCALC 131 (  H) 09/19/2019 1062    Physical Exam:    VS:  BP 116/76   Pulse 80   Ht 5\' 11"  (1.803 m)   Wt 207 lb 12.8 oz (94.3 kg)   SpO2 95%   BMI 28.98 kg/m     Wt Readings from Last 3 Encounters:  12/13/19 207 lb 12.8 oz (94.3 kg)  11/28/19 204 lb 12.9 oz (92.9 kg)  11/03/19 207 lb (93.9 kg)     GEN: Moderate obesity.. No acute distress.  Generous white long beard. HEENT: Normal NECK: No JVD. LYMPHATICS: No lymphadenopathy CARDIAC:  RRR without murmur, gallop, or edema. VASCULAR:  Normal Pulses. No bruits. RESPIRATORY:  Clear to auscultation without rales, wheezing or rhonchi  ABDOMEN: Soft, non-tender, non-distended, No pulsatile mass, MUSCULOSKELETAL: No deformity  SKIN: Warm and dry NEUROLOGIC:  Alert and oriented x 3 PSYCHIATRIC:  Normal affect   ASSESSMENT:    1. Coronary artery disease involving coronary bypass graft of native heart without angina pectoris   2. Radial artery occlusion, right (HCC)   3. Essential hypertension   4. Hyperlipidemia, unspecified hyperlipidemia type   5. S/P CABG x 2   6. Educated about COVID-19 virus infection   7. Acute combined systolic and diastolic heart failure (HCC)    PLAN:    In order of problems listed above:  1. Secondary prevention discussed. 2. Right radial pulse is intact. 3. Excellent blood pressure on current medical  regimen. 4. Blood work will be obtained today.  This will include a liver panel. 5. Status post LIMA to LAD and free saphenous vein graft to the diagonal. 6. He is practicing social distancing and mitigation techniques.  He is not Covid vaccinated. 7. He had acute combined systolic and diastolic heart failure immediately after surgery with reduced EF less than 45%.  An echocardiogram will be repeated over the next 3 months to assess current stable LVEF.  Overall education and awareness concerning primary/secondary risk prevention was discussed in detail: LDL less than 70, hemoglobin A1c less than 7, blood pressure target less than 130/80 mmHg, >150 minutes of moderate aerobic activity per week, avoidance of smoking, weight control (via diet and exercise), and continued surveillance/management of/for obstructive sleep apnea.    Medication Adjustments/Labs and Tests Ordered: Current medicines are reviewed at length with the patient today.  Concerns regarding medicines are outlined above.  No orders of the defined types were placed in this encounter.  No orders of the defined types were placed in this encounter.   There are no Patient Instructions on file for this visit.   Signed, 01/04/20, MD  12/13/2019 1:45 PM    [facility]

## 2019-12-13 ENCOUNTER — Encounter: Payer: Self-pay | Admitting: Interventional Cardiology

## 2019-12-13 ENCOUNTER — Ambulatory Visit: Payer: 59 | Admitting: Interventional Cardiology

## 2019-12-13 ENCOUNTER — Other Ambulatory Visit: Payer: Self-pay

## 2019-12-13 VITALS — BP 116/76 | HR 80 | Ht 71.0 in | Wt 207.8 lb

## 2019-12-13 DIAGNOSIS — I25810 Atherosclerosis of coronary artery bypass graft(s) without angina pectoris: Secondary | ICD-10-CM

## 2019-12-13 DIAGNOSIS — I70208 Unspecified atherosclerosis of native arteries of extremities, other extremity: Secondary | ICD-10-CM | POA: Diagnosis not present

## 2019-12-13 DIAGNOSIS — E785 Hyperlipidemia, unspecified: Secondary | ICD-10-CM | POA: Diagnosis not present

## 2019-12-13 DIAGNOSIS — Z7189 Other specified counseling: Secondary | ICD-10-CM

## 2019-12-13 DIAGNOSIS — I10 Essential (primary) hypertension: Secondary | ICD-10-CM

## 2019-12-13 DIAGNOSIS — I5041 Acute combined systolic (congestive) and diastolic (congestive) heart failure: Secondary | ICD-10-CM

## 2019-12-13 DIAGNOSIS — Z951 Presence of aortocoronary bypass graft: Secondary | ICD-10-CM

## 2019-12-13 NOTE — Patient Instructions (Signed)
Medication Instructions:  Your physician recommends that you continue on your current medications as directed. Please refer to the Current Medication list given to you today.  *If you need a refill on your cardiac medications before your next appointment, please call your pharmacy*   Lab Work: BMET, Liver, Lipid, and CBC today  If you have labs (blood work) drawn today and your tests are completely normal, you will receive your results only by: Marland Kitchen MyChart Message (if you have MyChart) OR . A paper copy in the mail If you have any lab test that is abnormal or we need to change your treatment, we will call you to review the results.   Testing/Procedures: Your physician has requested that you have an echocardiogram 5-7 days prior to seeing Dr. Katrinka Blazing back in 3 months. Echocardiography is a painless test that uses sound waves to create images of your heart. It provides your doctor with information about the size and shape of your heart and how well your heart's chambers and valves are working. This procedure takes approximately one hour. There are no restrictions for this procedure.     Follow-Up: At [facility], you and your health needs are our priority.  As part of our continuing mission to provide you with exceptional heart care, we have created designated Provider Care Teams.  These Care Teams include your primary Cardiologist (physician) and Advanced Practice Providers (APPs -  Physician Assistants and Nurse Practitioners) who all work together to provide you with the care you need, when you need it.  We recommend signing up for the patient portal called "MyChart".  Sign up information is provided on this After Visit Summary.  MyChart is used to connect with patients for Virtual Visits (Telemedicine).  Patients are able to view lab/test results, encounter notes, upcoming appointments, etc.  Non-urgent messages can be sent to your provider as well.   To learn more about what you can do  with MyChart, go to [email or web address].    Your next appointment:   3 month(s)  The format for your next appointment:   In Person  Provider:   You may see Lesleigh Noe, MD or one of the following Advanced Practice Providers on your designated Care Team:    Norma Fredrickson, NP  Nada Boozer, NP  Georgie Chard, NP    Other Instructions

## 2019-12-14 LAB — BASIC METABOLIC PANEL
BUN/Creatinine Ratio: 17 (ref 9–20)
BUN: 15 mg/dL (ref 6–24)
CO2: 25 mmol/L (ref 20–29)
Calcium: 9.5 mg/dL (ref 8.7–10.2)
Chloride: 103 mmol/L (ref 96–106)
Creatinine, Ser: 0.87 mg/dL (ref 0.76–1.27)
GFR calc Af Amer: 109 mL/min/{1.73_m2} (ref >59)
GFR calc non Af Amer: 94 mL/min/{1.73_m2} (ref >59)
Glucose: 128 mg/dL — ABNORMAL HIGH (ref 65–99)
Potassium: 4.7 mmol/L (ref 3.5–5.2)
Sodium: 141 mmol/L (ref 134–144)

## 2019-12-14 LAB — HEPATIC FUNCTION PANEL
ALT: 24 IU/L (ref 0–44)
AST: 21 IU/L (ref 0–40)
Albumin: 4.5 g/dL (ref 3.8–4.9)
Alkaline Phosphatase: 94 IU/L (ref 48–121)
Bilirubin Total: 0.6 mg/dL (ref 0.0–1.2)
Bilirubin, Direct: 0.17 mg/dL (ref 0.00–0.40)
Total Protein: 6.7 g/dL (ref 6.0–8.5)

## 2019-12-14 LAB — CBC
Hematocrit: 41 % (ref 37.5–51.0)
Hemoglobin: 13.5 g/dL (ref 13.0–17.7)
MCH: 29.4 pg (ref 26.6–33.0)
MCHC: 32.9 g/dL (ref 31.5–35.7)
MCV: 89 fL (ref 79–97)
Platelets: 300 10*3/uL (ref 150–450)
RBC: 4.59 x10E6/uL (ref 4.14–5.80)
RDW: 13 % (ref 11.6–15.4)
WBC: 7.5 10*3/uL (ref 3.4–10.8)

## 2019-12-14 LAB — LIPID PANEL
Chol/HDL Ratio: 3.1 ratio (ref 0.0–5.0)
Cholesterol, Total: 110 mg/dL (ref 100–199)
HDL: 35 mg/dL — ABNORMAL LOW (ref >39)
LDL Chol Calc (NIH): 60 mg/dL (ref 0–99)
Triglycerides: 70 mg/dL (ref 0–149)
VLDL Cholesterol Cal: 15 mg/dL (ref 5–40)

## 2019-12-15 ENCOUNTER — Encounter (HOSPITAL_COMMUNITY)
Admission: RE | Admit: 2019-12-15 | Discharge: 2019-12-15 | Disposition: A | Discharge status: Home / Self Care | Payer: 59 | Source: Ambulatory Visit | Attending: Interventional Cardiology | Admitting: Interventional Cardiology

## 2019-12-15 ENCOUNTER — Other Ambulatory Visit: Payer: Self-pay

## 2019-12-15 DIAGNOSIS — I2102 ST elevation (STEMI) myocardial infarction involving left anterior descending coronary artery: Secondary | ICD-10-CM

## 2019-12-18 ENCOUNTER — Encounter (HOSPITAL_COMMUNITY)
Admission: RE | Admit: 2019-12-18 | Discharge: 2019-12-18 | Disposition: A | Discharge status: Home / Self Care | Payer: 59 | Source: Ambulatory Visit

## 2019-12-18 ENCOUNTER — Encounter (HOSPITAL_COMMUNITY)
Admission: RE | Admit: 2019-12-18 | Discharge: 2019-12-18 | Disposition: A | Discharge status: Home / Self Care | Payer: 59 | Source: Ambulatory Visit | Attending: Interventional Cardiology | Admitting: Interventional Cardiology

## 2019-12-18 DIAGNOSIS — I2102 ST elevation (STEMI) myocardial infarction involving left anterior descending coronary artery: Secondary | ICD-10-CM

## 2019-12-19 ENCOUNTER — Other Ambulatory Visit: Payer: Self-pay

## 2019-12-19 NOTE — Progress Notes (Signed)
Reviewed home exercise Rx with patient. Pt voices he is currently walking 7x/week at home with his spouse for 20 minutes. Pt encouraged to walk for at least 30 minutes 3x/week at home in addition to his CRP2 program. Warm-up and cool-down encouraged.  Components of safe exercise program reviewed. Encouraged to make sure he has taken all prescribed medications before engaging in exercise. Encouraged to carry cell phone if walking outdoors. Reviewed exercising between 11-13 on RPE scale. Weather parameters reviewed for temperature and humidity. Fluids encouraged before, during and after exercise. Reviewed S/S that would require patient to terminate exercise; and when to call 911 vs. MD. Pt verbalized understanding of home exercise exercise Rx and was provided a copy.    Lorin Picket MS, ACSM-EP-C, CCRP

## 2019-12-20 ENCOUNTER — Encounter (HOSPITAL_COMMUNITY)
Admission: RE | Admit: 2019-12-20 | Discharge: 2019-12-20 | Disposition: A | Discharge status: Home / Self Care | Payer: 59 | Source: Ambulatory Visit | Attending: Interventional Cardiology | Admitting: Interventional Cardiology

## 2019-12-20 DIAGNOSIS — I2102 ST elevation (STEMI) myocardial infarction involving left anterior descending coronary artery: Secondary | ICD-10-CM

## 2019-12-22 ENCOUNTER — Encounter (HOSPITAL_COMMUNITY)
Admission: RE | Admit: 2019-12-22 | Discharge: 2019-12-22 | Disposition: A | Discharge status: Home / Self Care | Payer: 59 | Source: Ambulatory Visit | Attending: Interventional Cardiology | Admitting: Interventional Cardiology

## 2019-12-22 ENCOUNTER — Other Ambulatory Visit: Payer: Self-pay

## 2019-12-22 DIAGNOSIS — I2102 ST elevation (STEMI) myocardial infarction involving left anterior descending coronary artery: Secondary | ICD-10-CM | POA: Diagnosis not present

## 2019-12-25 ENCOUNTER — Encounter (HOSPITAL_COMMUNITY): Payer: 59

## 2019-12-27 ENCOUNTER — Encounter (HOSPITAL_COMMUNITY): Payer: 59

## 2019-12-29 ENCOUNTER — Encounter (HOSPITAL_COMMUNITY): Payer: 59

## 2020-01-02 ENCOUNTER — Encounter (HOSPITAL_COMMUNITY): Payer: Self-pay

## 2020-01-02 DIAGNOSIS — I2102 ST elevation (STEMI) myocardial infarction involving left anterior descending coronary artery: Secondary | ICD-10-CM

## 2020-01-02 NOTE — Progress Notes (Signed)
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  acetaminophen (TYLENOL) 325 MG tablet, Take 2 tablets (650 mg total) by mouth every 6 (six) hours as needed for mild pain., Disp: , Rfl:  .  aspirin EC 81 MG EC tablet, Take 1 tablet (81 mg total) by mouth daily., Disp: , Rfl:  .  atorvastatin (LIPITOR) 80 MG tablet, Take 1 tablet (80 mg total) by mouth daily., Disp: 90 tablet, Rfl: 3 .  carvedilol (COREG) 12.5 MG tablet, Take 1 tablet (12.5 mg total) by mouth 2 (two) times daily with a meal., Disp: 180 tablet, Rfl: 3 .  clopidogrel (PLAVIX) 75 MG tablet, Take 1 tablet (75 mg total) by mouth daily., Disp: 90 tablet, Rfl: 3 .  lisinopril (ZESTRIL) 2.5 MG tablet, Take 1 tablet (2.5 mg total) by mouth daily., Disp: 90 tablet, Rfl: 3  Past Medical History: Past Medical History:  Diagnosis Date  . Coronary artery disease   . GERD (gastroesophageal reflux disease)    "depending on what I eat."  . Hyperlipidemia   . Hypertension   . Myocardial infarction (HCC)    anterior Stemi  . PONV (postoperative nausea and vomiting)    ? anesthesia or Morphine    Tobacco Use: Social History   Tobacco Use  Smoking Status Former Smoker  . Years: 23.00  . Types: Cigarettes  . Quit date: 2001  . Years since quitting: 20.7  Smokeless Tobacco Never Used    Labs: Recent Hydrographic surveyor    Labs for ITP Cardiac and Pulmonary Rehab Latest Ref Rng & Units 09/19/2019 09/20/2019 09/21/2019 10/14/2019 12/13/2019    Cholestrol 100 - 199 mg/dL - - - - 629   LDLCALC 0 - 99 mg/dL - - - - 60   HDL >52 mg/dL - - - - 84(X)   Trlycerides 0 - 149 mg/dL - - - - 70   Hemoglobin A1c 4.8 - 5.6 % - - - - -   PHART 7.35 - 7.45 7.337(L) - - - -   PCO2ART 32 - 48 mmHg 40.2 - - - -   HCO3 20.0 - 28.0 mmol/L 21.5 - - - -   TCO2 22 - 32 mmol/L 23 - - 25 -   ACIDBASEDEF 0.0 - 2.0 mmol/L 4.0(H) - - - -   O2SAT % 97.0 72.6 64.8 - -      Capillary Blood Glucose: Lab Results  Component Value Date   GLUCAP 93 09/23/2019   GLUCAP 91 09/22/2019   GLUCAP 117 (H) 09/22/2019   GLUCAP 103 (H) 09/22/2019   GLUCAP 121 (H) 09/22/2019     Exercise Target Goals: Exercise Program Goal: Individual exercise prescription set using results from initial 6 min walk test and THRR while considering  patient's activity barriers and safety.   Exercise Prescription Goal: Initial exercise prescription builds to 30-45 minutes a day of aerobic activity, 2-3 days per week.  Home exercise guidelines will be given to patient during program as part of exercise  prescription that the participant will acknowledge.  Activity Barriers & Risk Stratification:  Activity Barriers & Cardiac Risk Stratification - 11/28/19 1528      Activity Barriers & Cardiac Risk Stratification   Activity Barriers None    Cardiac Risk Stratification Low           6 Minute Walk:  6 Minute Walk    Row Name 11/28/19 1430         6 Minute Walk   Phase Initial     Distance 1698 feet     Walk Time 6 minutes     # of Rest Breaks 0     MPH 3.21     METS 4.33     RPE 12     Perceived Dyspnea  1  Pt voices due to mask     VO2 Peak 15.1     Symptoms Yes (comment)     Comments RPD=1, some SOB which pt voices is due to mask     Resting HR 78 bpm     Resting BP 144/80     Resting Oxygen Saturation  98 %     Exercise Oxygen Saturation  during 6 min walk 97 %     Max Ex. HR 109 bpm     Max Ex. BP 156/80     2 Minute Post BP 138/80            Oxygen  Initial Assessment:   Oxygen Re-Evaluation:   Oxygen Discharge (Final Oxygen Re-Evaluation):   Initial Exercise Prescription:  Initial Exercise Prescription - 11/28/19 1500      Date of Initial Exercise RX and Referring Provider   Date 11/28/19    Referring Provider Verdis Prime, MD    Expected Discharge Date 01/26/20      Treadmill   MPH 2.7    Grade 0    Minutes 15    METs 3.07      NuStep   Level 2    SPM 70    Minutes 15    METs 2      Prescription Details   Frequency (times per week) 3    Duration Progress to 30 minutes of continuous aerobic without signs/symptoms of physical distress      Intensity   THRR 40-80% of Max Heartrate 64-129    Ratings of Perceived Exertion 11-13    Perceived Dyspnea 0-4      Progression   Progression Continue progressive overload as per policy without signs/symptoms or physical distress.      Resistance Training   Training Prescription Yes    Weight 4 lbs    Reps 10-15           Perform Capillary Blood Glucose checks as needed.  Exercise Prescription Changes:   Exercise Prescription Changes    Row Name 12/04/19 1445 12/18/19 1330 12/22/19 1430         Response to Exercise   Blood Pressure (Admit) 122/76 128/76 108/68     Blood Pressure (Exercise) 142/76 130/80 122/80     Blood Pressure (Exit) 112/70 112/66 100/50     Heart Rate (Admit) 75 bpm 66 bpm 81 bpm     Heart Rate (Exercise) 102 bpm 95 bpm 94 bpm     Heart Rate (Exit) 79 bpm 70 bpm 70 bpm     Rating of Perceived Exertion (Exercise) Symptoms None None None     Comments Pt's first day of exercise.  Tolerated well. Reviewed METs and Home exercise program Reviwed METs     Duration Progress to 30 minutes of  aerobic without signs/symptoms of physical distress Progress to 30 minutes of  aerobic without signs/symptoms of physical distress Progress to 30 minutes of  aerobic without signs/symptoms of physical distress     Intensity THRR unchanged THRR  unchanged THRR unchanged       Progression   Progression Continue to progress workloads to maintain intensity without signs/symptoms of physical distress. Continue to progress workloads to maintain intensity without signs/symptoms of physical distress. Continue to progress workloads to maintain intensity without signs/symptoms of physical distress.     Average METs 2.64 2.97 3.12       Resistance Training   Training Prescription Yes Yes Yes     Weight 4 lbs 4 lbs 4 lbs     Reps 10-15 10-15 10-15     Time 10 Minutes 10 Minutes 10 Minutes       Interval Training   Interval Training No No No       Treadmill   MPH 2.7 2.7 2.7     Grade 0 1 1     Minutes 15 15 15      METs 3.07 3.44 3.44       NuStep   Level 2 3 4      SPM 80 80 85     Minutes 15 15 15      METs 2.2 2.5 2.8       Home Exercise Plan   Plans to continue exercise at -- Home (comment) Home (comment)     Frequency -- Add 3 additional days to program exercise sessions. Add 3 additional days to program exercise sessions.     Initial Home Exercises Provided -- 12/18/19 12/18/19            Exercise Comments:   Exercise Comments    Row Name 12/04/19 1445 12/18/19 1500 12/22/19 1500 01/04/20 1227     Exercise Comments Pt's first day of exericse. Tolerated well with no complaints. Reviewed METs and home exercise Rx and was provieded copy of the Rx. Reviwed METs. Pt is progressing well in CRP2 program. Pt is also walking daily with his wife 45 minutes. Pt has not attened CRP2 program since 12/22/2019. No additional updatre on progess at this time.           Exercise Goals and Review:   Exercise Goals    Row Name 11/28/19 1528             Exercise Goals   Increase Physical Activity Yes       Intervention Provide advice, education, support and counseling about physical activity/exercise needs.;Develop an individualized exercise prescription for aerobic and resistive training based on initial evaluation findings,  risk stratification, comorbidities and participant's personal goals.       Expected Outcomes Short Term: Attend rehab on a regular basis to increase amount of physical activity.;Long Term: Add in home exercise to make exercise part of routine and to increase amount of physical activity.;Long Term: Exercising regularly at least 3-5 days a week.       Increase Strength and Stamina Yes       Intervention Provide advice, education, support and counseling about physical activity/exercise needs.;Develop an individualized exercise prescription for aerobic and resistive training based on initial evaluation findings, risk stratification, comorbidities and participant's personal goals.       Expected Outcomes Short Term: Increase workloads from initial exercise prescription for resistance,  speed, and METs.;Short Term: Perform resistance training exercises routinely during rehab and add in resistance training at home;Long Term: Improve cardiorespiratory fitness, muscular endurance and strength as measured by increased METs and functional capacity ( )       Able to understand and use rate of perceived exertion (RPE) scale Yes       Intervention Provide education and explanation on how to use RPE scale       Expected Outcomes Short Term: Able to use RPE daily in rehab to express subjective intensity level;Long Term:  Able to use RPE to guide intensity level when exercising independently       Knowledge and understanding of Target Heart Rate Range (THRR) Yes       Intervention Provide education and explanation of THRR including how the numbers were predicted and where they are located for reference       Expected Outcomes Short Term: Able to state/look up THRR;Long Term: Able to use THRR to govern intensity when exercising independently;Short Term: Able to use daily as guideline for intensity in rehab       Able to check pulse independently Yes       Intervention Review the importance of being able to check your  own pulse for safety during independent exercise;Provide education and demonstration on how to check pulse in carotid and radial arteries.       Expected Outcomes Short Term: Able to explain why pulse checking is important during independent exercise;Long Term: Able to check pulse independently and accurately       Understanding of Exercise Prescription Yes       Intervention Provide education, explanation, and written materials on patient's individual exercise prescription       Expected Outcomes Short Term: Able to explain program exercise prescription;Long Term: Able to explain home exercise prescription to exercise independently              Exercise Goals Re-Evaluation :  Exercise Goals Re-Evaluation    Row Name 12/04/19 1445 12/05/19 0746 12/18/19 1545         Exercise Goal Re-Evaluation   Exercise Goals Review Increase Physical Activity;Increase Strength and Stamina;Able to understand and use rate of perceived exertion (RPE) scale;Knowledge and understanding of Target Heart Rate Range (THRR);Able to check pulse independently;Understanding of Exercise Prescription -- Increase Physical Activity;Increase Strength and Stamina;Able to understand and use rate of perceived exertion (RPE) scale;Knowledge and understanding of Target Heart Rate Range (THRR);Able to check pulse independently;Understanding of Exercise Prescription     Comments Pt's first day of exercise the CRP2 program. Pt tolerated session well with no complaints and understnads Exercise Rx, RPE scale, and THRR. -- Reviewed METs and home exercise Rx. Pt verbalized understanding of home exercise Rx and was provided a copy.     Expected Outcomes Will continue to monitor patient ans progress as tolerated. -- Pt will walk at home 3-4x/week for 30-45 minutes.            Discharge Exercise Prescription (Final Exercise Prescription Changes):  Exercise Prescription Changes - 12/22/19 1430      Response to Exercise   Blood Pressure  (Admit) 108/68    Blood Pressure (Exercise) 122/80    Blood Pressure (Exit) 100/50    Heart Rate (Admit) 81 bpm    Heart Rate (Exercise) 94 bpm    Heart Rate (Exit) 70 bpm    Rating of Perceived Exertion (Exercise) 12    Symptoms None    Comments Reviwed METs  Duration Progress to 30 minutes of  aerobic without signs/symptoms of physical distress    Intensity THRR unchanged      Progression   Progression Continue to progress workloads to maintain intensity without signs/symptoms of physical distress.    Average METs 3.12      Resistance Training   Training Prescription Yes    Weight 4 lbs    Reps 10-15    Time 10 Minutes      Interval Training   Interval Training No      Treadmill   MPH 2.7    Grade 1    Minutes 15    METs 3.44      NuStep   Level 4    SPM 85    Minutes 15    METs 2.8      Home Exercise Plan   Plans to continue exercise at Home (comment)    Frequency Add 3 additional days to program exercise sessions.    Initial Home Exercises Provided 12/18/19           Nutrition:  Target Goals: Understanding of nutrition guidelines, daily intake of sodium 1500mg , cholesterol 200mg , calories 30% from fat and 7% or less from saturated fats, daily to have 5 or more servings of fruits and vegetables.  Biometrics:  Pre Biometrics - 11/28/19 1330      Pre Biometrics   Waist Circumference 41 inches    Hip Circumference 40 inches    Waist to Hip Ratio 1.03 %    Triceps Skinfold 13 mm    % Body Fat 28 %    Grip Strength 41 kg    Flexibility 14.75 in    Single Leg Stand 51.68 seconds            Nutrition Therapy Plan and Nutrition Goals:  Nutrition Therapy & Goals - 12/06/19 1436      Nutrition Therapy   Diet Heart Healthy      Personal Nutrition Goals   Nutrition Goal Pt to understand how diet impacts his CBGs and lipid panel    Personal Goal #2 Pt to build a healthy plate including vegetables, fruits, whole grains, and low-fat dairy products in  a heart healthy meal plan.      Intervention Plan   Intervention Nutrition handout(s) given to patient.;Prescribe, educate and counsel regarding individualized specific dietary modifications aiming towards targeted core components such as weight, hypertension, lipid management, diabetes, heart failure and other comorbidities.    Expected Outcomes Short Term Goal: A plan has been developed with personal nutrition goals set during dietitian appointment.;Long Term Goal: Adherence to prescribed nutrition plan.           Nutrition Assessments:  Nutrition Assessments - 12/06/19 1443      MEDFICTS Scores   Pre Score 22           Nutrition Goals Re-Evaluation:  Nutrition Goals Re-Evaluation    Row Name 12/06/19 1443             Goals   Current Weight 204 lb (92.5 kg)       Nutrition Goal Pt to understand how diet impacts his CBGs and lipid panel         Personal Goal #2 Re-Evaluation   Personal Goal #2 Pt to build a healthy plate including vegetables, fruits, whole grains, and low-fat dairy products in a heart healthy meal plan.              Nutrition Goals Re-Evaluation:  Nutrition  Goals Re-Evaluation    Row Name 12/06/19 1443             Goals   Current Weight 204 lb (92.5 kg)       Nutrition Goal Pt to understand how diet impacts his CBGs and lipid panel         Personal Goal #2 Re-Evaluation   Personal Goal #2 Pt to build a healthy plate including vegetables, fruits, whole grains, and low-fat dairy products in a heart healthy meal plan.              Nutrition Goals Discharge (Final Nutrition Goals Re-Evaluation):  Nutrition Goals Re-Evaluation - 12/06/19 1443      Goals   Current Weight 204 lb (92.5 kg)    Nutrition Goal Pt to understand how diet impacts his CBGs and lipid panel      Personal Goal #2 Re-Evaluation   Personal Goal #2 Pt to build a healthy plate including vegetables, fruits, whole grains, and low-fat dairy products in a heart healthy meal  plan.           Psychosocial: Target Goals: Acknowledge presence or absence of significant depression and/or stress, maximize coping skills, provide positive support system. Participant is able to verbalize types and ability to use techniques and skills needed for reducing stress and depression.  Initial Review & Psychosocial Screening:  Initial Psych Review & Screening - 11/28/19 1618      Initial Review   Current issues with Current Stress Concerns    Source of Stress Concerns Family    Comments unfortunately Mr Herard's brother was shot and killed last week. The funeral was on 11/26/19      Family Dynamics   Good Support System? Yes   Harvie Heck has his wife for support     Barriers   Psychosocial barriers to participate in program The patient should benefit from training in stress management and relaxation.      Screening Interventions   Interventions Encouraged to exercise;To provide support and resources with identified psychosocial needs    Expected Outcomes Short Term goal: Utilizing psychosocial counselor, staff and physician to assist with identification of specific Stressors or current issues interfering with healing process. Setting desired goal for each stressor or current issue identified.           Quality of Life Scores:  Quality of Life - 11/28/19 1539      Quality of Life   Select Quality of Life      Quality of Life Scores   Health/Function Pre 27.2 %    Socioeconomic Pre 27.43 %    Psych/Spiritual Pre 27.43 %    Family Pre 26.4 %    GLOBAL Pre 27.18 %          Scores of 19 and below usually indicate a poorer quality of life in these areas.  A difference of  2-3 points is a clinically meaningful difference.  A difference of 2-3 points in the total score of the Quality of Life Index has been associated with significant improvement in overall quality of life, self-image, physical symptoms, and general health in studies assessing change in quality of  life.  PHQ-9: Recent Review Flowsheet Data    Depression screen [facility] 2/9 11/28/2019   Decreased Interest 0   Down, Depressed, Hopeless 0   PHQ - 2 Score 0     Interpretation of Total Score  Total Score Depression Severity:  1-4 = Minimal depression, 5-9 = Mild depression, 10-14 =  Moderate depression, 15-19 = Moderately severe depression, 20-27 = Severe depression   Psychosocial Evaluation and Intervention:  Psychosocial Evaluation - 12/04/19 1631      Psychosocial Evaluation & Interventions   Interventions Encouraged to exercise with the program and follow exercise prescription;Stress management education    Comments Mr. Hickson presents to his first cardiac rehab exercise session with a positive attitude and outlook. He denies psychosocial barriers to self health managment. He does admit to stress related to the recent death of his brother. He utilizes fishing, yard work, walking with spouse, and swimming as stress reducers. He has a good support system of family and friends. No interventions needed at this time however patient is encouraged to participate in CR and utilize hobbies for stress reduction.    Expected Outcomes Mr. Wach will continue to have a positive outlook and attitude. He will utilize his hobbies and support system to cope with stress as it arises.    Continue Psychosocial Services  Follow up required by staff           Psychosocial Re-Evaluation:  Psychosocial Re-Evaluation    Row Name 01/02/20 1306 01/02/20 1308           Psychosocial Re-Evaluation   Current issues with None Identified --      Comments Mr. Amaral presents to cardiac rehab exercise sessions with a positive attitude and outlook. He denies psychosocial barriers to self health managment during last encounter. He does admit to stress related to the recent death of his brother. He utilizes fishing, yard work, walking with spouse, and swimming as stress reducers. He has a good support system of family and  friends. No interventions needed at this time however patient is encouraged to participate in CR and utilize hobbies for stress reduction. --      Expected Outcomes -- Mr. Drummond will continue to have a positive outlook and attitude. He will utilize his hobbies and support system to cope with stress as it arises.      Interventions Encouraged to attend Cardiac Rehabilitation for the exercise --      Continue Psychosocial Services  Follow up required by staff --             Psychosocial Discharge (Final Psychosocial Re-Evaluation):  Psychosocial Re-Evaluation - 01/02/20 1308      Psychosocial Re-Evaluation   Expected Outcomes Mr. Dalby will continue to have a positive outlook and attitude. He will utilize his hobbies and support system to cope with stress as it arises.           Vocational Rehabilitation: Provide vocational rehab assistance to qualifying candidates.   Vocational Rehab Evaluation & Intervention:  Vocational Rehab - 11/28/19 1621      Initial Vocational Rehab Evaluation & Intervention   Assessment shows need for Vocational Rehabilitation No   Harvie Heck is currently working full time and does not need vocational rehab at this time          Education: Education Goals: Education classes will be provided on a weekly basis, covering required topics. Participant will state understanding/return demonstration of topics presented.  Learning Barriers/Preferences:  Learning Barriers/Preferences - 11/28/19 1540      Learning Barriers/Preferences   Learning Barriers Sight   Wears glasses   Learning Preferences Audio;Computer/Internet;Individual Instruction;Skilled Demonstration           Education Topics: Count Your Pulse:  -Group instruction provided by verbal instruction, demonstration, patient participation and written materials to support subject.  Instructors address importance of  being able to find your pulse and how to count your pulse when at home without a heart  monitor.  Patients get hands on experience counting their pulse with staff help and individually.   Heart Attack, Angina, and Risk Factor Modification:  -Group instruction provided by verbal instruction, video, and written materials to support subject.  Instructors address signs and symptoms of angina and heart attacks.    Also discuss risk factors for heart disease and how to make changes to improve heart health risk factors.   Functional Fitness:  -Group instruction provided by verbal instruction, demonstration, patient participation, and written materials to support subject.  Instructors address safety measures for doing things around the house.  Discuss how to get up and down off the floor, how to pick things up properly, how to safely get out of a chair without assistance, and balance training.   Meditation and Mindfulness:  -Group instruction provided by verbal instruction, patient participation, and written materials to support subject.  Instructor addresses importance of mindfulness and meditation practice to help reduce stress and improve awareness.  Instructor also leads participants through a meditation exercise.    Stretching for Flexibility and Mobility:  -Group instruction provided by verbal instruction, patient participation, and written materials to support subject.  Instructors lead participants through series of stretches that are designed to increase flexibility thus improving mobility.  These stretches are additional exercise for major muscle groups that are typically performed during regular warm up and cool down.   Hands Only CPR:  -Group verbal, video, and participation provides a basic overview of AHA guidelines for community CPR. Role-play of emergencies allow participants the opportunity to practice calling for help and chest compression technique with discussion of AED use.   Hypertension: -Group verbal and written instruction that provides a basic overview of  hypertension including the most recent diagnostic guidelines, risk factor reduction with self-care instructions and medication management.    Nutrition I class: Heart Healthy Eating:  -Group instruction provided by PowerPoint slides, verbal discussion, and written materials to support subject matter. The instructor gives an explanation and review of the Therapeutic Lifestyle Changes diet recommendations, which includes a discussion on lipid goals, dietary fat, sodium, fiber, plant stanol/sterol esters, sugar, and the components of a well-balanced, healthy diet.   Nutrition II class: Lifestyle Skills:  -Group instruction provided by PowerPoint slides, verbal discussion, and written materials to support subject matter. The instructor gives an explanation and review of label reading, grocery shopping for heart health, heart healthy recipe modifications, and ways to make healthier choices when eating out.   Diabetes Question & Answer:  -Group instruction provided by PowerPoint slides, verbal discussion, and written materials to support subject matter. The instructor gives an explanation and review of diabetes co-morbidities, pre- and post-prandial blood glucose goals, pre-exercise blood glucose goals, signs, symptoms, and treatment of hypoglycemia and hyperglycemia, and foot care basics.   Diabetes Blitz:  -Group instruction provided by PowerPoint slides, verbal discussion, and written materials to support subject matter. The instructor gives an explanation and review of the physiology behind type 1 and type 2 diabetes, diabetes medications and rational behind using different medications, pre- and post-prandial blood glucose recommendations and Hemoglobin A1c goals, diabetes diet, and exercise including blood glucose guidelines for exercising safely.    Portion Distortion:  -Group instruction provided by PowerPoint slides, verbal discussion, written materials, and food models to support subject  matter. The instructor gives an explanation of serving size versus portion size, changes in portions  sizes over the last 20 years, and what consists of a serving from each food group.   Stress Management:  -Group instruction provided by verbal instruction, video, and written materials to support subject matter.  Instructors review role of stress in heart disease and how to cope with stress positively.     Exercising on Your Own:  -Group instruction provided by verbal instruction, power point, and written materials to support subject.  Instructors discuss benefits of exercise, components of exercise, frequency and intensity of exercise, and end points for exercise.  Also discuss use of nitroglycerin and activating EMS.  Review options of places to exercise outside of rehab.  Review guidelines for sex with heart disease.   Cardiac Drugs I:  -Group instruction provided by verbal instruction and written materials to support subject.  Instructor reviews cardiac drug classes: antiplatelets, anticoagulants, beta blockers, and statins.  Instructor discusses reasons, side effects, and lifestyle considerations for each drug class.   Cardiac Drugs II:  -Group instruction provided by verbal instruction and written materials to support subject.  Instructor reviews cardiac drug classes: angiotensin converting enzyme inhibitors (ACE-I), angiotensin II receptor blockers (ARBs), nitrates, and calcium channel blockers.  Instructor discusses reasons, side effects, and lifestyle considerations for each drug class.   Anatomy and Physiology of the Circulatory System:  Group verbal and written instruction and models provide basic cardiac anatomy and physiology, with the coronary electrical and arterial systems. Review of: AMI, Angina, Valve disease, Heart Failure, Peripheral Artery Disease, Cardiac Arrhythmia, Pacemakers, and the ICD.   Other Education:  -Group or individual verbal, written, or video instructions  that support the educational goals of the cardiac rehab program.   Holiday Eating Survival Tips:  -Group instruction provided by PowerPoint slides, verbal discussion, and written materials to support subject matter. The instructor gives patients tips, tricks, and techniques to help them not only survive but enjoy the holidays despite the onslaught of food that accompanies the holidays.   Knowledge Questionnaire Score:  Knowledge Questionnaire Score - 11/28/19 1543      Knowledge Questionnaire Score   Pre Score 22/24           Core Components/Risk Factors/Patient Goals at Admission:  Personal Goals and Risk Factors at Admission - 11/28/19 1621      Core Components/Risk Factors/Patient Goals on Admission    Weight Management Yes;Weight Maintenance    Intervention Weight Management: Develop a combined nutrition and exercise program designed to reach desired caloric intake, while maintaining appropriate intake of nutrient and fiber, sodium and fats, and appropriate energy expenditure required for the weight goal.;Weight Management: Provide education and appropriate resources to help participant work on and attain dietary goals.;Weight Management/Obesity: Establish reasonable short term and long term weight goals.    Admit Weight 204 lb 12.9 oz (92.9 kg)    Goal Weight: Long Term 200 lb (90.7 kg)    Expected Outcomes Short Term: Continue to assess and modify interventions until short term weight is achieved;Long Term: Adherence to nutrition and physical activity/exercise program aimed toward attainment of established weight goal;Weight Maintenance: Understanding of the daily nutrition guidelines, which includes 25-35% calories from fat, 7% or less cal from saturated fats, less than 200mg  cholesterol, less than 1.5gm of sodium, & 5 or more servings of fruits and vegetables daily;Weight Loss: Understanding of general recommendations for a balanced deficit meal plan, which promotes 1-2 lb weight  loss per week and includes a negative energy balance of [phone] kcal/d;Understanding recommendations for meals to include 15-35% energy  as protein, 25-35% energy from fat, 35-60% energy from carbohydrates, less than 200mg  of dietary cholesterol, 20-35 gm of total fiber daily;Understanding of distribution of calorie intake throughout the day with the consumption of 4-5 meals/snacks    Hypertension Yes    Intervention Provide education on lifestyle modifcations including regular physical activity/exercise, weight management, moderate sodium restriction and increased consumption of fresh fruit, vegetables, and low fat dairy, alcohol moderation, and smoking cessation.;Monitor prescription use compliance.    Expected Outcomes Short Term: Continued assessment and intervention until BP is < 140/49mm HG in hypertensive participants. < 130/36mm HG in hypertensive participants with diabetes, heart failure or chronic kidney disease.;Long Term: Maintenance of blood pressure at goal levels.    Lipids Yes    Intervention Provide education and support for participant on nutrition & aerobic/resistive exercise along with prescribed medications to achieve LDL 70mg , HDL >40mg .    Expected Outcomes Short Term: Participant states understanding of desired cholesterol values and is compliant with medications prescribed. Participant is following exercise prescription and nutrition guidelines.;Long Term: Cholesterol controlled with medications as prescribed, with individualized exercise RX and with personalized nutrition plan. Value goals: LDL < 70mg , HDL > 40 mg.    Stress Yes    Intervention Offer individual and/or small group education and counseling on adjustment to heart disease, stress management and health-related lifestyle change. Teach and support self-help strategies.;Refer participants experiencing significant psychosocial distress to appropriate mental health specialists for further evaluation and treatment. When  possible, include family members and significant others in education/counseling sessions.    Expected Outcomes Short Term: Participant demonstrates changes in health-related behavior, relaxation and other stress management skills, ability to obtain effective social support, and compliance with psychotropic medications if prescribed.;Long Term: Emotional wellbeing is indicated by absence of clinically significant psychosocial distress or social isolation.           Core Components/Risk Factors/Patient Goals Review:   Goals and Risk Factor Review    Row Name 12/04/19 1634 01/02/20 1308           Core Components/Risk Factors/Patient Goals Review   Personal Goals Review Weight Management/Obesity;Lipids;Stress;Hypertension Weight Management/Obesity;Lipids;Stress;Hypertension      Review Mr. Snider has multiple CAD risk factors. He is eager to participate in CR for risk factor reduction. His goals are to increase his stamina and strength. Mr. Savastano has multiple CAD risk factors. He continues to participate in CR although his attendance is very limited. His goals are to increase his stamina and strength.      Expected Outcomes Patient will continue to participate in CR for risk factor reduction and education. Patient will continue to participate in CR for risk factor reduction and education.             Core Components/Risk Factors/Patient Goals at Discharge (Final Review):   Goals and Risk Factor Review - 01/02/20 1308      Core Components/Risk Factors/Patient Goals Review   Personal Goals Review Weight Management/Obesity;Lipids;Stress;Hypertension    Review Mr. Parkey has multiple CAD risk factors. He continues to participate in CR although his attendance is very limited. His goals are to increase his stamina and strength.    Expected Outcomes Patient will continue to participate in CR for risk factor reduction and education.           ITP Comments:  ITP Comments    Row Name 11/28/19 1614  12/04/19 1628 01/02/20 1123       ITP Comments Dr 02/03/20 MD, Medical Director 30 day ITP  review: Mr. Twilley completed his first cardiac rehab exercise session today and tolerated well. Denied complaints. VSS. Eager to participate in CR for risk factor modifications. Goals are to maintain current weight and increase his stamina and strength. He hopes to return to his previous state of activity level prior to cardiac event. Will continue to monitor progression towards goals over the next 30 days. 30 day ITP review: Mr. Pullara completed 7 cardiac rehab exercise sessions since admission 11/28/19. He has had multiple absences. When he is present he works to an RPE of 11-12 with average mets 2.5 to 3.4 Goals are to maintain current weight and increase his stamina and strength. He hopes to return to his previous state of activity level prior to cardiac event. Will review progression towards goals during next encounter.            Comments: see ITP comments

## 2020-01-03 ENCOUNTER — Encounter (HOSPITAL_COMMUNITY): Payer: 59

## 2020-01-05 ENCOUNTER — Encounter (HOSPITAL_COMMUNITY): Payer: 59

## 2020-01-08 ENCOUNTER — Encounter (HOSPITAL_COMMUNITY): Payer: 59

## 2020-01-10 ENCOUNTER — Telehealth (HOSPITAL_COMMUNITY): Payer: Self-pay

## 2020-01-10 ENCOUNTER — Other Ambulatory Visit: Payer: 59

## 2020-01-10 ENCOUNTER — Encounter (HOSPITAL_COMMUNITY): Payer: 59

## 2020-01-10 NOTE — Telephone Encounter (Signed)
Cardiac Rehab Note:   Unsuccessful telephone encounter to [employer] to discuss extended absence (last 6 sessions) from cardiac rehab exercise sessions. [employer] compliant VM message left requesting callback to [phone].  Wahid Holley E. Suzie Portela RN, BSN [place]  Cardiac and Pulmonary Rehabilitation Phone: [phone] Fax: [phone]

## 2020-01-12 ENCOUNTER — Encounter (HOSPITAL_COMMUNITY): Payer: 59

## 2020-01-15 ENCOUNTER — Telehealth (HOSPITAL_COMMUNITY): Payer: Self-pay

## 2020-01-15 ENCOUNTER — Encounter (HOSPITAL_COMMUNITY): Payer: Self-pay

## 2020-01-15 ENCOUNTER — Encounter (HOSPITAL_COMMUNITY): Payer: 59

## 2020-01-15 NOTE — Progress Notes (Signed)
Discharge Progress Report  Patient Details  Name: [name] MRN: [id number] Date of Birth: 1960-08-15 Referring Provider:     CARDIAC REHAB PHASE II ORIENTATION from 11/28/2019 in [facility]ARDI[facility]  Referring Provider Verdis Prime, MD       Number of Visits: 7  Reason for Discharge:  Early Exit:  Back to work  Smoking History:  Social History   Tobacco Use  Smoking Status Former Smoker  . Years: 23.00  . Types: Cigarettes  . Quit date: 2001  . Years since quitting: 20.7  Smokeless Tobacco Never Used    Diagnosis:  No diagnosis found.  ADL UCSD:   Initial Exercise Prescription:  Initial Exercise Prescription - 11/28/19 1500      Date of Initial Exercise RX and Referring Provider   Date 11/28/19    Referring Provider Verdis Prime, MD    Expected Discharge Date 01/26/20      Treadmill   MPH 2.7    Grade 0    Minutes 15    METs 3.07      NuStep   Level 2    SPM 70    Minutes 15    METs 2      Prescription Details   Frequency (times per week) 3    Duration Progress to 30 minutes of continuous aerobic without signs/symptoms of physical distress      Intensity   THRR 40-80% of Max Heartrate 64-129    Ratings of Perceived Exertion 11-13    Perceived Dyspnea 0-4      Progression   Progression Continue progressive overload as per policy without signs/symptoms or physical distress.      Resistance Training   Training Prescription Yes    Weight 4 lbs    Reps 10-15           Discharge Exercise Prescription (Final Exercise Prescription Changes):  Exercise Prescription Changes - 12/22/19 1430      Response to Exercise   Blood Pressure (Admit) 108/68    Blood Pressure (Exercise) 122/80    Blood Pressure (Exit) 100/50    Heart Rate (Admit) 81 bpm    Heart Rate (Exercise) 94 bpm    Heart Rate (Exit) 70 bpm    Rating of Perceived Exertion (Exercise) 12    Symptoms None    Comments Reviwed METs    Duration Progress to 30  minutes of  aerobic without signs/symptoms of physical distress    Intensity THRR unchanged      Progression   Progression Continue to progress workloads to maintain intensity without signs/symptoms of physical distress.    Average METs 3.12      Resistance Training   Training Prescription Yes    Weight 4 lbs    Reps 10-15    Time 10 Minutes      Interval Training   Interval Training No      Treadmill   MPH 2.7    Grade 1    Minutes 15    METs 3.44      NuStep   Level 4    SPM 85    Minutes 15    METs 2.8      Home Exercise Plan   Plans to continue exercise at Home (comment)    Frequency Add 3 additional days to program exercise sessions.    Initial Home Exercises Provided 12/18/19           Functional Capacity:  6 Minute Walk  Row Name 11/28/19 1430         6 Minute Walk   Phase Initial     Distance 1698 feet     Walk Time 6 minutes     # of Rest Breaks 0     MPH 3.21     METS 4.33     RPE 12     Perceived Dyspnea  1  Pt voices due to mask     VO2 Peak 15.1     Symptoms Yes (comment)     Comments RPD=1, some SOB which pt voices is due to mask     Resting HR 78 bpm     Resting BP 144/80     Resting Oxygen Saturation  98 %     Exercise Oxygen Saturation  during 6 min walk 97 %     Max Ex. HR 109 bpm     Max Ex. BP 156/80     2 Minute Post BP 138/80            Psychological, QOL, Others - Outcomes: PHQ 2/9: Depression screen PHQ 2/9 11/28/2019  Decreased Interest 0  Down, Depressed, Hopeless 0  PHQ - 2 Score 0    Quality of Life:  Quality of Life - 11/28/19 1539      Quality of Life   Select Quality of Life      Quality of Life Scores   Health/Function Pre 27.2 %    Socioeconomic Pre 27.43 %    Psych/Spiritual Pre 27.43 %    Family Pre 26.4 %    GLOBAL Pre 27.18 %           Personal Goals: Goals established at orientation with interventions provided to work toward goal.  Personal Goals and Risk Factors at Admission - 11/28/19  1621      Core Components/Risk Factors/Patient Goals on Admission    Weight Management Yes;Weight Maintenance    Intervention Weight Management: Develop a combined nutrition and exercise program designed to reach desired caloric intake, while maintaining appropriate intake of nutrient and fiber, sodium and fats, and appropriate energy expenditure required for the weight goal.;Weight Management: Provide education and appropriate resources to help participant work on and attain dietary goals.;Weight Management/Obesity: Establish reasonable short term and long term weight goals.    Admit Weight 204 lb 12.9 oz (92.9 kg)    Goal Weight: Long Term 200 lb (90.7 kg)    Expected Outcomes Short Term: Continue to assess and modify interventions until short term weight is achieved;Long Term: Adherence to nutrition and physical activity/exercise program aimed toward attainment of established weight goal;Weight Maintenance: Understanding of the daily nutrition guidelines, which includes 25-35% calories from fat, 7% or less cal from saturated fats, less than 200mg  cholesterol, less than 1.5gm of sodium, & 5 or more servings of fruits and vegetables daily;Weight Loss: Understanding of general recommendations for a balanced deficit meal plan, which promotes 1-2 lb weight loss per week and includes a negative energy balance of [phone] kcal/d;Understanding recommendations for meals to include 15-35% energy as protein, 25-35% energy from fat, 35-60% energy from carbohydrates, less than 200mg  of dietary cholesterol, 20-35 gm of total fiber daily;Understanding of distribution of calorie intake throughout the day with the consumption of 4-5 meals/snacks    Hypertension Yes    Intervention Provide education on lifestyle modifcations including regular physical activity/exercise, weight management, moderate sodium restriction and increased consumption of fresh fruit, vegetables, and low fat dairy, alcohol moderation, and smoking  cessation.;Monitor  prescription use compliance.    Expected Outcomes Short Term: Continued assessment and intervention until BP is < 140/37mm HG in hypertensive participants. < 130/47mm HG in hypertensive participants with diabetes, heart failure or chronic kidney disease.;Long Term: Maintenance of blood pressure at goal levels.    Lipids Yes    Intervention Provide education and support for participant on nutrition & aerobic/resistive exercise along with prescribed medications to achieve LDL 70mg , HDL >40mg .    Expected Outcomes Short Term: Participant states understanding of desired cholesterol values and is compliant with medications prescribed. Participant is following exercise prescription and nutrition guidelines.;Long Term: Cholesterol controlled with medications as prescribed, with individualized exercise RX and with personalized nutrition plan. Value goals: LDL < , HDL > 40 mg.    Stress Yes    Intervention Offer individual and/or small group education and counseling on adjustment to heart disease, stress management and health-related lifestyle change. Teach and support self-help strategies.;Refer participants experiencing significant psychosocial distress to appropriate mental health specialists for further evaluation and treatment. When possible, include family members and significant others in education/counseling sessions.    Expected Outcomes Short Term: Participant demonstrates changes in health-related behavior, relaxation and other stress management skills, ability to obtain effective social support, and compliance with psychotropic medications if prescribed.;Long Term: Emotional wellbeing is indicated by absence of clinically significant psychosocial distress or social isolation.            Personal Goals Discharge:  Goals and Risk Factor Review    Row Name 12/04/19 1634 01/02/20 1308           Core Components/Risk Factors/Patient Goals Review   Personal Goals Review  Weight Management/Obesity;Lipids;Stress;Hypertension Weight Management/Obesity;Lipids;Stress;Hypertension      Review Mr. Garis has multiple CAD risk factors. He is eager to participate in CR for risk factor reduction. His goals are to increase his stamina and strength. Mr. Vandevoorde has multiple CAD risk factors. He continues to participate in CR although his attendance is very limited. His goals are to increase his stamina and strength.      Expected Outcomes Patient will continue to participate in CR for risk factor reduction and education. Patient will continue to participate in CR for risk factor reduction and education.             Exercise Goals and Review:  Exercise Goals    Row Name 11/28/19 1528             Exercise Goals   Increase Physical Activity Yes       Intervention Provide advice, education, support and counseling about physical activity/exercise needs.;Develop an individualized exercise prescription for aerobic and resistive training based on initial evaluation findings, risk stratification, comorbidities and participant's personal goals.       Expected Outcomes Short Term: Attend rehab on a regular basis to increase amount of physical activity.;Long Term: Add in home exercise to make exercise part of routine and to increase amount of physical activity.;Long Term: Exercising regularly at least 3-5 days a week.       Increase Strength and Stamina Yes       Intervention Provide advice, education, support and counseling about physical activity/exercise needs.;Develop an individualized exercise prescription for aerobic and resistive training based on initial evaluation findings, risk stratification, comorbidities and participant's personal goals.       Expected Outcomes Short Term: Increase workloads from initial exercise prescription for resistance, speed, and METs.;Short Term: Perform resistance training exercises routinely during rehab and add in resistance training at home;Long  Term:  Improve cardiorespiratory fitness, muscular endurance and strength as measured by increased METs and functional capacity ( )       Able to understand and use rate of perceived exertion (RPE) scale Yes       Intervention Provide education and explanation on how to use RPE scale       Expected Outcomes Short Term: Able to use RPE daily in rehab to express subjective intensity level;Long Term:  Able to use RPE to guide intensity level when exercising independently       Knowledge and understanding of Target Heart Rate Range (THRR) Yes       Intervention Provide education and explanation of THRR including how the numbers were predicted and where they are located for reference       Expected Outcomes Short Term: Able to state/look up THRR;Long Term: Able to use THRR to govern intensity when exercising independently;Short Term: Able to use daily as guideline for intensity in rehab       Able to check pulse independently Yes       Intervention Review the importance of being able to check your own pulse for safety during independent exercise;Provide education and demonstration on how to check pulse in carotid and radial arteries.       Expected Outcomes Short Term: Able to explain why pulse checking is important during independent exercise;Long Term: Able to check pulse independently and accurately       Understanding of Exercise Prescription Yes       Intervention Provide education, explanation, and written materials on patient's individual exercise prescription       Expected Outcomes Short Term: Able to explain program exercise prescription;Long Term: Able to explain home exercise prescription to exercise independently              Exercise Goals Re-Evaluation:  Exercise Goals Re-Evaluation    Row Name 12/04/19 1445 12/05/19 0746 12/18/19 1545         Exercise Goal Re-Evaluation   Exercise Goals Review Increase Physical Activity;Increase Strength and Stamina;Able to understand and use rate of  perceived exertion (RPE) scale;Knowledge and understanding of Target Heart Rate Range (THRR);Able to check pulse independently;Understanding of Exercise Prescription -- Increase Physical Activity;Increase Strength and Stamina;Able to understand and use rate of perceived exertion (RPE) scale;Knowledge and understanding of Target Heart Rate Range (THRR);Able to check pulse independently;Understanding of Exercise Prescription     Comments Pt's first day of exercise the CRP2 program. Pt tolerated session well with no complaints and understnads Exercise Rx, RPE scale, and THRR. -- Reviewed METs and home exercise Rx. Pt verbalized understanding of home exercise Rx and was provided a copy.     Expected Outcomes Will continue to monitor patient ans progress as tolerated. -- Pt will walk at home 3-4x/week for 30-45 minutes.            Nutrition & Weight - Outcomes:  Pre Biometrics - 11/28/19 1330      Pre Biometrics   Waist Circumference 41 inches    Hip Circumference 40 inches    Waist to Hip Ratio 1.03 %    Triceps Skinfold 13 mm    % Body Fat 28 %    Grip Strength 41 kg    Flexibility 14.75 in    Single Leg Stand 51.68 seconds            Nutrition:  Nutrition Therapy & Goals - 12/06/19 1436      Nutrition Therapy   Diet Heart  Healthy      Personal Nutrition Goals   Nutrition Goal Pt to understand how diet impacts his CBGs and lipid panel    Personal Goal #2 Pt to build a healthy plate including vegetables, fruits, whole grains, and low-fat dairy products in a heart healthy meal plan.      Intervention Plan   Intervention Nutrition handout(s) given to patient.;Prescribe, educate and counsel regarding individualized specific dietary modifications aiming towards targeted core components such as weight, hypertension, lipid management, diabetes, heart failure and other comorbidities.    Expected Outcomes Short Term Goal: A plan has been developed with personal nutrition goals set during  dietitian appointment.;Long Term Goal: Adherence to prescribed nutrition plan.           Nutrition Discharge:  Nutrition Assessments - 12/06/19 1443      MEDFICTS Scores   Pre Score 22           Education Questionnaire Score:  Knowledge Questionnaire Score - 11/28/19 1543      Knowledge Questionnaire Score   Pre Score 22/24          Incoming call from Mr. [place] [employer]. States he will not be returning to CR. He has gone back to work full time. He continues to walk every other day with his wife for 30-40 min. States he feels "great" and thankful for the care he recieved in CR. Denies barriers to self health management although he struggles with depression symptoms secondary to the recent murder of his brother. He declines counseling at this time. Patient will be discharged per his request. States he will bring hospital access badge back sometime this week.

## 2020-01-15 NOTE — Telephone Encounter (Signed)
Cardiac Rehab Note:   Second unsuccessful telephone encounter to [employer] [place] to discuss extended absence (last 7 sessions) from cardiac rehab exercise sessions. [employer] compliant VM message left requesting callback to [phone].  Reid Nawrot E. Suzie Portela RN, BSN [place]. [facility]  Cardiac and Pulmonary Rehabilitation Phone: [phone] Fax: [phone]

## 2020-01-17 ENCOUNTER — Encounter (HOSPITAL_COMMUNITY): Payer: 59

## 2020-01-19 ENCOUNTER — Encounter (HOSPITAL_COMMUNITY): Payer: 59

## 2020-01-22 ENCOUNTER — Encounter (HOSPITAL_COMMUNITY): Payer: 59

## 2020-01-24 ENCOUNTER — Encounter (HOSPITAL_COMMUNITY): Payer: 59

## 2020-01-26 ENCOUNTER — Encounter (HOSPITAL_COMMUNITY): Payer: 59

## 2020-03-13 ENCOUNTER — Ambulatory Visit (HOSPITAL_COMMUNITY): Payer: 59 | Attending: Internal Medicine

## 2020-03-13 ENCOUNTER — Other Ambulatory Visit: Payer: Self-pay

## 2020-03-13 DIAGNOSIS — I5041 Acute combined systolic (congestive) and diastolic (congestive) heart failure: Secondary | ICD-10-CM | POA: Diagnosis present

## 2020-03-13 LAB — ECHOCARDIOGRAM COMPLETE
Area-P 1/2: 4.68 cm2
S' Lateral: 2.7 cm

## 2020-03-13 MED ORDER — PERFLUTREN LIPID MICROSPHERE
1.0000 mL | INTRAVENOUS | Status: AC | PRN
  Administered 2020-03-13: 2 mL via INTRAVENOUS

## 2020-03-19 NOTE — Progress Notes (Signed)
Cardiology Office Note:    Date:  03/20/2020   ID:  [name], DOB July 13, 1960, MRN [id number]  PCP:  Patient, No Pcp Per  Cardiologist:  Lesleigh Noe, MD   Referring MD: No ref. provider found   Chief Complaint  Patient presents with  . Coronary Artery Disease    History of Present Illness:    [name] is a 59 y.o. male with a hx of anterior STEMI and underwent emergency CABG with LIMA to LAD and saphenous vein graft to diagonal by Dr. 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  He is doing well.  He has not had angina.  Right hand is doing well.  No medication side effects other than cramping which he thinks might be related to Lipitor.  He has been measuring his blood pressures at home and they have run consistently less than 130/80 mmHg.   Past Medical History:  Diagnosis Date  . Coronary artery disease   . GERD (gastroesophageal reflux disease)    "depending on what I eat."  . Hyperlipidemia   . Hypertension   . Myocardial infarction (HCC)    anterior Stemi  . PONV (postoperative nausea and vomiting)    ? anesthesia or Morphine    Past Surgical History:  Procedure Laterality Date  . CARDIAC CATHETERIZATION    . CORONARY ARTERY BYPASS GRAFT N/A 09/19/2019   Procedure: CORONARY ARTERY BYPASS GRAFTING (CABG) using LIMA to LAD; Endoscopic Right Greater Saphenous Vein Harvest: SVG to Diag.;  Surgeon: Delight Ovens, MD;  Location: [facility] OR;  Service: Open Heart Surgery;  Laterality: N/A;  . CORONARY BALLOON ANGIOPLASTY N/A 09/19/2019   Procedure: CORONARY BALLOON ANGIOPLASTY;  Surgeon: Lyn Records, MD;  Location: [facility] INVASIVE CV LAB;  Service: Cardiovascular;  Laterality: N/A;  . ENDOVEIN HARVEST OF GREATER SAPHENOUS VEIN Right 09/19/2019   Procedure: Mack Guise Of Greater Saphenous Vein;  Surgeon: Delight Ovens, MD;  Location: [facility] OR;   Service: Open Heart Surgery;  Laterality: Right;  . IABP INSERTION N/A 09/19/2019   Procedure: IABP Insertion;  Surgeon: Lyn Records, MD;  Location: [facility] INVASIVE CV LAB;  Service: Cardiovascular;  Laterality: N/A;  . LEFT HEART CATH AND CORONARY ANGIOGRAPHY N/A 09/19/2019   Procedure: LEFT HEART CATH AND CORONARY ANGIOGRAPHY;  Surgeon: Lyn Records, MD;  Location: [facility] INVASIVE CV LAB;  Service: Cardiovascular;  Laterality: N/A;  . TEE WITHOUT CARDIOVERSION N/A 09/19/2019   Procedure: TRANSESOPHAGEAL ECHOCARDIOGRAM (TEE);  Surgeon: Delight Ovens, MD;  Location: [facility] OR;  Service: Open Heart Surgery;  Laterality: N/A;  . THROMBECTOMY BRACHIAL ARTERY Right 10/14/2019   Procedure: THROMBECTOMY RIGHT RADIAL ARTERY;  Surgeon: Maeola Harman, MD;  Location: [facility] OR;  Service: Vascular;  Laterality: Right;    Current Medications: Current Meds  Medication Sig  . acetaminophen (TYLENOL) 325 MG tablet Take 2 tablets (650 mg total) by mouth every 6 (six) hours as needed for mild pain.  Marland Kitchen aspirin EC 81 MG EC tablet Take 1 tablet (81 mg total) by mouth daily.  Marland Kitchen atorvastatin (LIPITOR) 80 MG tablet Take 1 tablet (80 mg total) by mouth daily.  . carvedilol (COREG) 12.5 MG tablet Take 1 tablet (12.5 mg total) by mouth 2 (two) times daily with a meal.  . clopidogrel (PLAVIX) 75 MG tablet Take 1 tablet (75 mg total) by mouth daily.  Marland Kitchen lisinopril (ZESTRIL) 2.5 MG tablet  Take 1 tablet (2.5 mg total) by mouth daily.     Allergies:   Morphine and related   Social History   Socioeconomic History  . Marital status: Married    Spouse name: Not on file  . Number of children: Not on file  . Years of education: 4412  . Highest education level: High school graduate  Occupational History  . Not on file  Tobacco Use  . Smoking status: Former Smoker    Years: 23.00    Types: Cigarettes    Quit date: 2001    Years since quitting: 20.9  . Smokeless tobacco: Never Used  Vaping Use  . Vaping Use: Never  used  Substance and Sexual Activity  . Alcohol use: Yes    Comment: occasional  . Drug use: Never  . Sexual activity: Not on file  Other Topics Concern  . Not on file  Social History Narrative  . Not on file   Social Determinants of Health   Financial Resource Strain:   . Difficulty of Paying Living Expenses: Not on file  Food Insecurity:   . Worried About Programme researcher, broadcasting/film/videounning Out of Food in the Last Year: Not on file  . Ran Out of Food in the Last Year: Not on file  Transportation Needs:   . Lack of Transportation (Medical): Not on file  . Lack of Transportation (Non-Medical): Not on file  Physical Activity:   . Days of Exercise per Week: Not on file  . Minutes of Exercise per Session: Not on file  Stress:   . Feeling of Stress : Not on file  Social Connections:   . Frequency of Communication with Friends and Family: Not on file  . Frequency of Social Gatherings with Friends and Family: Not on file  . Attends Religious Services: Not on file  . Active Member of Clubs or Organizations: Not on file  . Attends BankerClub or Organization Meetings: Not on file  . Marital Status: Not on file     Family History: The patient's family history is not on file.  ROS:   Please see the history of present illness.    Cramps in his legs.  All other systems reviewed and are negative.  EKGs/Labs/Other Studies Reviewed:    The following studies were reviewed today:  2 D Echocardiogram 02/2020: IMPRESSIONS    1. Left ventricular ejection fraction, by estimation, is 65 to 70%. The  left ventricle has normal function. The left ventricle has no regional  wall motion abnormalities. There is mild left ventricular hypertrophy.  Left ventricular diastolic parameters  are consistent with Grade I diastolic dysfunction (impaired relaxation).  2. Right ventricular systolic function is normal. The right ventricular  size is normal. There is normal pulmonary artery systolic pressure. The  estimated right  ventricular systolic pressure is 33.9 mmHg.  3. The mitral valve is grossly normal. Trivial mitral valve  regurgitation.  4. The aortic valve is tricuspid. Aortic valve regurgitation is not  visualized.  5. The inferior vena cava is normal in size with greater than 50%  respiratory variability, suggesting right atrial pressure of 3 mmHg.   Comparison(s): Changes from prior study are noted. 09/19/2019: LVEF 40% by  TEE.   EKG:  EKG not repeated  Recent Labs: 09/20/2019: Magnesium 2.5 12/13/2019: ALT 24; BUN 15; Creatinine, Ser 0.87; Hemoglobin 13.5; Platelets 300; Potassium 4.7; Sodium 141  Recent Lipid Panel    Component Value Date/Time   CHOL 110 12/13/2019 1350   TRIG 70 12/13/2019  1350   HDL 35 (L) 12/13/2019 1350   CHOLHDL 3.1 12/13/2019 1350   CHOLHDL 4.7 09/19/2019 0644   VLDL 29 09/19/2019 0644   LDLCALC 60 12/13/2019 1350    Physical Exam:    VS:  BP 140/89   Pulse 71   Temp (!) 97.5 F (36.4 C)   Ht 5\' 10"  (1.778 m)   Wt 207 lb 12.8 oz (94.3 kg)   SpO2 96%   BMI 29.82 kg/m     Wt Readings from Last 3 Encounters:  03/20/20 207 lb 12.8 oz (94.3 kg)  12/13/19 207 lb 12.8 oz (94.3 kg)  11/28/19 204 lb 12.9 oz (92.9 kg)     GEN: Overweight. No acute distress HEENT: Normal NECK: No JVD. LYMPHATICS: No lymphadenopathy CARDIAC:  RRR without murmur, gallop, or edema. VASCULAR:  Normal Pulses. No bruits.  The radial pulse in the right wrist is 2+. RESPIRATORY:  Clear to auscultation without rales, wheezing or rhonchi  ABDOMEN: Soft, non-tender, non-distended, No pulsatile mass, MUSCULOSKELETAL: No deformity  SKIN: Warm and dry NEUROLOGIC:  Alert and oriented x 3 PSYCHIATRIC:  Normal affect   ASSESSMENT:    1. Coronary artery disease involving coronary bypass graft of native heart without angina pectoris   2. Radial artery occlusion, right (HCC)   3. Essential hypertension   4. Hyperlipidemia, unspecified hyperlipidemia type   5. Acute combined systolic  and diastolic heart failure (HCC)   6. Educated about COVID-19 virus infection    PLAN:    In order of problems listed above:  1. Secondary prevention discussed in detail.  Continue Lipitor 80 mg/day.  Needs have a lipid panel done in May when he returns.  Aerobic activity encouraged. 2. Radial pulses 2+ after thrombectomy. 3. Continue carvedilol 12.5 mg twice daily and Zestril 2.5 mg/day to blood pressure.  He understands a target is less than 130/80 mmHg. 4. Continue Lipitor 80 mg/day.  Lipid panel in May. 5. LV function has improved to EF 65% from 40% during the acute event.  Continue beta-blocker and Zestril.  These medications are mostly being used for blood pressure now since LV function has recovered. 6. He has been vaccinated and has not suffered COVID-19 disease.   Medication Adjustments/Labs and Tests Ordered: Current medicines are reviewed at length with the patient today.  Concerns regarding medicines are outlined above.  No orders of the defined types were placed in this encounter.  No orders of the defined types were placed in this encounter.   Patient Instructions  Medication Instructions:  Your physician recommends that you continue on your current medications as directed. Please refer to the Current Medication list given to you today.  *If you need a refill on your cardiac medications before your next appointment, please call your pharmacy*   Lab Work: None If you have labs (blood work) drawn today and your tests are completely normal, you will receive your results only by: June MyChart Message (if you have MyChart) OR . A paper copy in the mail If you have any lab test that is abnormal or we need to change your treatment, we will call you to review the results.   Testing/Procedures: None   Follow-Up: At [facility], you and your health needs are our priority.  As part of our continuing mission to provide you with exceptional heart care, we have created  designated Provider Care Teams.  These Care Teams include your primary Cardiologist (physician) and Advanced Practice Providers (APPs -  Physician Assistants  and Nurse Practitioners) who all work together to provide you with the care you need, when you need it.  We recommend signing up for the patient portal called "MyChart".  Sign up information is provided on this After Visit Summary.  MyChart is used to connect with patients for Virtual Visits (Telemedicine).  Patients are able to view lab/test results, encounter notes, upcoming appointments, etc.  Non-urgent messages can be sent to your provider as well.   To learn more about what you can do with MyChart, go to [email or web address].    Your next appointment:   6 month(s)  The format for your next appointment:   In Person  Provider:   You may see Lesleigh Noe, MD or one of the following Advanced Practice Providers on your designated Care Team:    Norma Fredrickson, NP  Nada Boozer, NP  Georgie Chard, NP    Other Instructions      Signed, Lesleigh Noe, MD  03/20/2020 4:53 PM    [facility]

## 2020-03-20 ENCOUNTER — Other Ambulatory Visit: Payer: Self-pay

## 2020-03-20 ENCOUNTER — Encounter: Payer: Self-pay | Admitting: Interventional Cardiology

## 2020-03-20 ENCOUNTER — Ambulatory Visit: Payer: 59 | Admitting: Interventional Cardiology

## 2020-03-20 VITALS — BP 140/89 | HR 71 | Temp 97.5°F | Ht 70.0 in | Wt 207.8 lb

## 2020-03-20 DIAGNOSIS — I25810 Atherosclerosis of coronary artery bypass graft(s) without angina pectoris: Secondary | ICD-10-CM

## 2020-03-20 DIAGNOSIS — E785 Hyperlipidemia, unspecified: Secondary | ICD-10-CM

## 2020-03-20 DIAGNOSIS — I70208 Unspecified atherosclerosis of native arteries of extremities, other extremity: Secondary | ICD-10-CM | POA: Diagnosis not present

## 2020-03-20 DIAGNOSIS — Z7189 Other specified counseling: Secondary | ICD-10-CM

## 2020-03-20 DIAGNOSIS — I5041 Acute combined systolic (congestive) and diastolic (congestive) heart failure: Secondary | ICD-10-CM

## 2020-03-20 DIAGNOSIS — I10 Essential (primary) hypertension: Secondary | ICD-10-CM

## 2020-03-20 NOTE — Patient Instructions (Signed)

## 2020-05-30 ENCOUNTER — Other Ambulatory Visit: Payer: Self-pay | Admitting: *Deleted

## 2020-05-30 DIAGNOSIS — I25810 Atherosclerosis of coronary artery bypass graft(s) without angina pectoris: Secondary | ICD-10-CM

## 2020-05-30 DIAGNOSIS — E785 Hyperlipidemia, unspecified: Secondary | ICD-10-CM

## 2020-06-05 ENCOUNTER — Other Ambulatory Visit: Payer: 59

## 2020-06-05 ENCOUNTER — Other Ambulatory Visit: Payer: Self-pay

## 2020-06-05 DIAGNOSIS — E785 Hyperlipidemia, unspecified: Secondary | ICD-10-CM

## 2020-06-05 DIAGNOSIS — I25810 Atherosclerosis of coronary artery bypass graft(s) without angina pectoris: Secondary | ICD-10-CM

## 2020-06-06 LAB — LIPID PANEL
Chol/HDL Ratio: 3 ratio (ref 0.0–5.0)
Cholesterol, Total: 121 mg/dL (ref 100–199)
HDL: 40 mg/dL (ref >39)
LDL Chol Calc (NIH): 67 mg/dL (ref 0–99)
Triglycerides: 67 mg/dL (ref 0–149)
VLDL Cholesterol Cal: 14 mg/dL (ref 5–40)

## 2020-06-06 LAB — HEPATIC FUNCTION PANEL
ALT: 29 IU/L (ref 0–44)
AST: 22 IU/L (ref 0–40)
Albumin: 4.3 g/dL (ref 3.8–4.9)
Alkaline Phosphatase: 90 IU/L (ref 44–121)
Bilirubin Total: 0.5 mg/dL (ref 0.0–1.2)
Bilirubin, Direct: 0.14 mg/dL (ref 0.00–0.40)
Total Protein: 6.5 g/dL (ref 6.0–8.5)

## 2020-09-15 NOTE — Progress Notes (Signed)
Cardiology Office Note:    Date:  09/16/2020   ID:  [name], DOB June 04, 1960, MRN [id number]  PCP:  Patient, No Pcp Per (Inactive)  Cardiologist:  Javier Noe, MD   Referring MD: No ref. provider found   Chief Complaint  Patient presents with  . Coronary Artery Disease  . Hyperlipidemia    History of Present Illness:    [name] is a 60 y.o. male with a hx of primary hypertension, hyperlipidemia, acute combined systolic and diastolic heart failure, obesity, anterior STEMI,and emergency CABGwith LIMA to LAD and saphenous vein graft to diagonal Sep 19, 2019  Exam is doing well.  He has not had any cardiac symptoms and has not had no complaints to suggest angina.  He is able to work without limitations.  No medication side effects.  He has stopped drinking ice tea.  He is try to be careful with carbohydrates in his diet.  He limits sodium in his diet.  Past Medical History:  Diagnosis Date  . Coronary artery disease   . GERD (gastroesophageal reflux disease)    "depending on what I eat."  . Hyperlipidemia   . Hypertension   . Myocardial infarction (HCC)    anterior Stemi  . PONV (postoperative nausea and vomiting)    ? anesthesia or Morphine    Past Surgical History:  Procedure Laterality Date  . CARDIAC CATHETERIZATION    . CORONARY ARTERY BYPASS GRAFT N/A 09/19/2019   Procedure: CORONARY ARTERY BYPASS GRAFTING (CABG) using LIMA to LAD; Endoscopic Right Greater Saphenous Vein Harvest: SVG to Diag.;  Surgeon: Delight Ovens, MD;  Location: [facility] OR;  Service: Open Heart Surgery;  Laterality: N/A;  . CORONARY BALLOON ANGIOPLASTY N/A 09/19/2019   Procedure: CORONARY BALLOON ANGIOPLASTY;  Surgeon: Lyn Records, MD;  Location: [facility] INVASIVE CV LAB;  Service: Cardiovascular;  Laterality: N/A;  . ENDOVEIN HARVEST OF GREATER SAPHENOUS VEIN Right 09/19/2019   Procedure: Mack Guise Of Greater Saphenous Vein;  Surgeon: Delight Ovens, MD;  Location: [facility] OR;   Service: Open Heart Surgery;  Laterality: Right;  . IABP INSERTION N/A 09/19/2019   Procedure: IABP Insertion;  Surgeon: Lyn Records, MD;  Location: [facility] INVASIVE CV LAB;  Service: Cardiovascular;  Laterality: N/A;  . LEFT HEART CATH AND CORONARY ANGIOGRAPHY N/A 09/19/2019   Procedure: LEFT HEART CATH AND CORONARY ANGIOGRAPHY;  Surgeon: Lyn Records, MD;  Location: [facility] INVASIVE CV LAB;  Service: Cardiovascular;  Laterality: N/A;  . TEE WITHOUT CARDIOVERSION N/A 09/19/2019   Procedure: TRANSESOPHAGEAL ECHOCARDIOGRAM (TEE);  Surgeon: Delight Ovens, MD;  Location: [facility] OR;  Service: Open Heart Surgery;  Laterality: N/A;  . THROMBECTOMY BRACHIAL ARTERY Right 10/14/2019   Procedure: THROMBECTOMY RIGHT RADIAL ARTERY;  Surgeon: Maeola Harman, MD;  Location: [facility] OR;  Service: Vascular;  Laterality: Right;    Current Medications: Current Meds  Medication Sig  . acetaminophen (TYLENOL) 325 MG tablet Take 2 tablets (650 mg total) by mouth every 6 (six) hours as needed for mild pain.  Marland Kitchen aspirin EC 81 MG EC tablet Take 1 tablet (81 mg total) by mouth daily.  Marland Kitchen atorvastatin (LIPITOR) 80 MG tablet Take 1 tablet (80 mg total) by mouth daily.  . carvedilol (COREG) 12.5 MG tablet Take 1 tablet (12.5 mg total) by mouth 2 (two) times daily with a meal.  . clopidogrel (PLAVIX) 75 MG tablet Take 1 tablet (75 mg total) by mouth daily.  Marland Kitchen lisinopril (ZESTRIL) 2.5 MG tablet  Take 1 tablet (2.5 mg total) by mouth daily.     Allergies:   Morphine and related   Social History   Socioeconomic History  . Marital status: Married    Spouse name: Not on file  . Number of children: Not on file  . Years of education: 43  . Highest education level: High school graduate  Occupational History  . Not on file  Tobacco Use  . Smoking status: Former Smoker    Years: 23.00    Types: Cigarettes    Quit date: 2001    Years since quitting: 21.4  . Smokeless tobacco: Never Used  Vaping Use  . Vaping Use: Never  used  Substance and Sexual Activity  . Alcohol use: Yes    Comment: occasional  . Drug use: Never  . Sexual activity: Not on file  Other Topics Concern  . Not on file  Social History Narrative  . Not on file   Social Determinants of Health   Financial Resource Strain: Not on file  Food Insecurity: Not on file  Transportation Needs: Not on file  Physical Activity: Not on file  Stress: Not on file  Social Connections: Not on file     Family History: The patient's family history includes Cancer in his maternal grandmother; Diabetes Mellitus I in his brother; Fibromyalgia in his father, paternal grandmother, and sister; Heart disease in his brother and father; Hypertension in his maternal grandmother, mother, and paternal grandmother; Lung cancer in his maternal grandfather; Pancreatic cancer in his maternal grandfather.  ROS:   Please see the history of present illness.    No claudication, orthopnea, PND, transient neurological symptoms.  All other systems reviewed and are negative.  EKGs/Labs/Other Studies Reviewed:    The following studies were reviewed today: ECHOCARDIOGRAM 03/13/2020: IMPRESSIONS  1. Left ventricular ejection fraction, by estimation, is 65 to 70%. The  left ventricle has normal function. The left ventricle has no regional  wall motion abnormalities. There is mild left ventricular hypertrophy.  Left ventricular diastolic parameters  are consistent with Grade I diastolic dysfunction (impaired relaxation).  2. Right ventricular systolic function is normal. The right ventricular  size is normal. There is normal pulmonary artery systolic pressure. The  estimated right ventricular systolic pressure is 33.9 mmHg.  3. The mitral valve is grossly normal. Trivial mitral valve  regurgitation.  4. The aortic valve is tricuspid. Aortic valve regurgitation is not  visualized.  5. The inferior vena cava is normal in size with greater than 50%  respiratory  variability, suggesting right atrial pressure of 3 mmHg.   Comparison(s): Changes from prior study are noted. 09/19/2019: LVEF 40% by  TEE.   EKG:  EKG EKG is norm with PR interval 202 ms.  Recent Labs: 09/20/2019: Magnesium 2.5 12/13/2019: BUN 15; Creatinine, Ser 0.87; Hemoglobin 13.5; Platelets 300; Potassium 4.7; Sodium 141 06/05/2020: ALT 29  Recent Lipid Panel    Component Value Date/Time   CHOL 121 06/05/2020 0749   TRIG 67 06/05/2020 0749   HDL 40 06/05/2020 0749   CHOLHDL 3.0 06/05/2020 0749   CHOLHDL 4.7 09/19/2019 0644   VLDL 29 09/19/2019 0644   LDLCALC 67 06/05/2020 0749    Physical Exam:    VS:  BP (!) 156/78   Pulse 64   Ht 5\' 10"  (1.778 m)   Wt 216 lb 3.2 oz (98.1 kg)   SpO2 94%   BMI 31.02 kg/m     Wt Readings from Last 3 Encounters:  09/16/20  216 lb 3.2 oz (98.1 kg)  03/20/20 207 lb 12.8 oz (94.3 kg)  12/13/19 207 lb 12.8 oz (94.3 kg)     GEN: Full beard. No acute distress HEENT: Normal NECK: No JVD. LYMPHATICS: No lymphadenopathy CARDIAC: No murmur. RRR no gallop, or edema. VASCULAR:  Normal Pulses. No bruits. RESPIRATORY:  Clear to auscultation without rales, wheezing or rhonchi  ABDOMEN: Soft, non-tender, non-distended, No pulsatile mass, MUSCULOSKELETAL: No deformity  SKIN: Warm and dry NEUROLOGIC:  Alert and oriented x 3 PSYCHIATRIC:  Normal affect   ASSESSMENT:    1. Coronary artery disease involving coronary bypass graft of native heart without angina pectoris   2. Hyperlipidemia, unspecified hyperlipidemia type   3. Essential hypertension   4. Radial artery occlusion, right (HCC)   5. Acute combined systolic and diastolic heart failure (HCC)    PLAN:    In order of problems listed above:  1. Secondary prevention discussed.  Continue aspirin 81 mg/day. 2. LDL is at target on current statin intensity, Lipitor 80 mg/day. 3. Blood pressure needs to be monitored at home.  2.5 g sodium diet recommended.  Moderately aerobic activity  recommended.  Current blood pressure initially 156/82.  Repeat at 138/88 mmHg.  I recommended he monitor his blood pressure 2-3 times per month over the next 3 months and if he is consistently above 140/90 mmHg that he should call. 4. Decreased pulse in the right radial 5. Systolic dysfunction resolved when echo done 1 year ago.  Overall education and awareness concerning primary/secondary risk prevention was discussed in detail: LDL less than 70, hemoglobin A1c less than 7, blood pressure target less than 130/80 mmHg, >150 minutes of moderate aerobic activity per week, avoidance of smoking, weight control (via diet and exercise), and continued surveillance/management of/for obstructive sleep apnea.    Medication Adjustments/Labs and Tests Ordered: Current medicines are reviewed at length with the patient today.  Concerns regarding medicines are outlined above.  Orders Placed This Encounter  Procedures  . EKG 12-Lead   No orders of the defined types were placed in this encounter.   Patient Instructions  Medication Instructions:  Your physician recommends that you continue on your current medications as directed. Please refer to the Current Medication list given to you today.  *If you need a refill on your cardiac medications before your next appointment, please call your pharmacy*   Lab Work: None If you have labs (blood work) drawn today and your tests are completely normal, you will receive your results only by: Marland Kitchen MyChart Message (if you have MyChart) OR . A paper copy in the mail If you have any lab test that is abnormal or we need to change your treatment, we will call you to review the results.   Testing/Procedures: None   Follow-Up: At [facility], you and your health needs are our priority.  As part of our continuing mission to provide you with exceptional heart care, we have created designated Provider Care Teams.  These Care Teams include your primary Cardiologist  (physician) and Advanced Practice Providers (APPs -  Physician Assistants and Nurse Practitioners) who all work together to provide you with the care you need, when you need it.  We recommend signing up for the patient portal called "MyChart".  Sign up information is provided on this After Visit Summary.  MyChart is used to connect with patients for Virtual Visits (Telemedicine).  Patients are able to view lab/test results, encounter notes, upcoming appointments, etc.  Non-urgent messages can be  sent to your provider as well.   To learn more about what you can do with MyChart, go to [email or web address].    Your next appointment:   1 year(s)  The format for your next appointment:   In Person  Provider:   You may see Javier NoeHenry W Azavier Creson III, MD or one of the following Advanced Practice Providers on your designated Care Team:    Georgie ChardJill McDaniel, NP    Other Instructions      Signed, Javier NoeHenry W Danie Diehl III, MD  09/16/2020 5:19 PM    [facility]

## 2020-09-16 ENCOUNTER — Other Ambulatory Visit: Payer: Self-pay

## 2020-09-16 ENCOUNTER — Ambulatory Visit: Payer: 59 | Admitting: Interventional Cardiology

## 2020-09-16 ENCOUNTER — Encounter: Payer: Self-pay | Admitting: Interventional Cardiology

## 2020-09-16 VITALS — BP 156/78 | HR 64 | Ht 70.0 in | Wt 216.2 lb

## 2020-09-16 DIAGNOSIS — I5041 Acute combined systolic (congestive) and diastolic (congestive) heart failure: Secondary | ICD-10-CM

## 2020-09-16 DIAGNOSIS — E785 Hyperlipidemia, unspecified: Secondary | ICD-10-CM

## 2020-09-16 DIAGNOSIS — I70208 Unspecified atherosclerosis of native arteries of extremities, other extremity: Secondary | ICD-10-CM | POA: Diagnosis not present

## 2020-09-16 DIAGNOSIS — I25810 Atherosclerosis of coronary artery bypass graft(s) without angina pectoris: Secondary | ICD-10-CM

## 2020-09-16 DIAGNOSIS — I10 Essential (primary) hypertension: Secondary | ICD-10-CM | POA: Diagnosis not present

## 2020-09-16 NOTE — Patient Instructions (Signed)

## 2020-09-29 ENCOUNTER — Other Ambulatory Visit: Payer: Self-pay | Admitting: Physician Assistant

## 2020-11-08 ENCOUNTER — Other Ambulatory Visit: Payer: Self-pay | Admitting: Interventional Cardiology

## 2021-01-20 IMAGING — DX DG CHEST 1V PORT
1 series · 1 of 1 positions shown · non-contrast
Comparison: September 21, 2019.

CLINICAL DATA: Chest tube.

EXAM:
PORTABLE CHEST 1 VIEW

[chest]
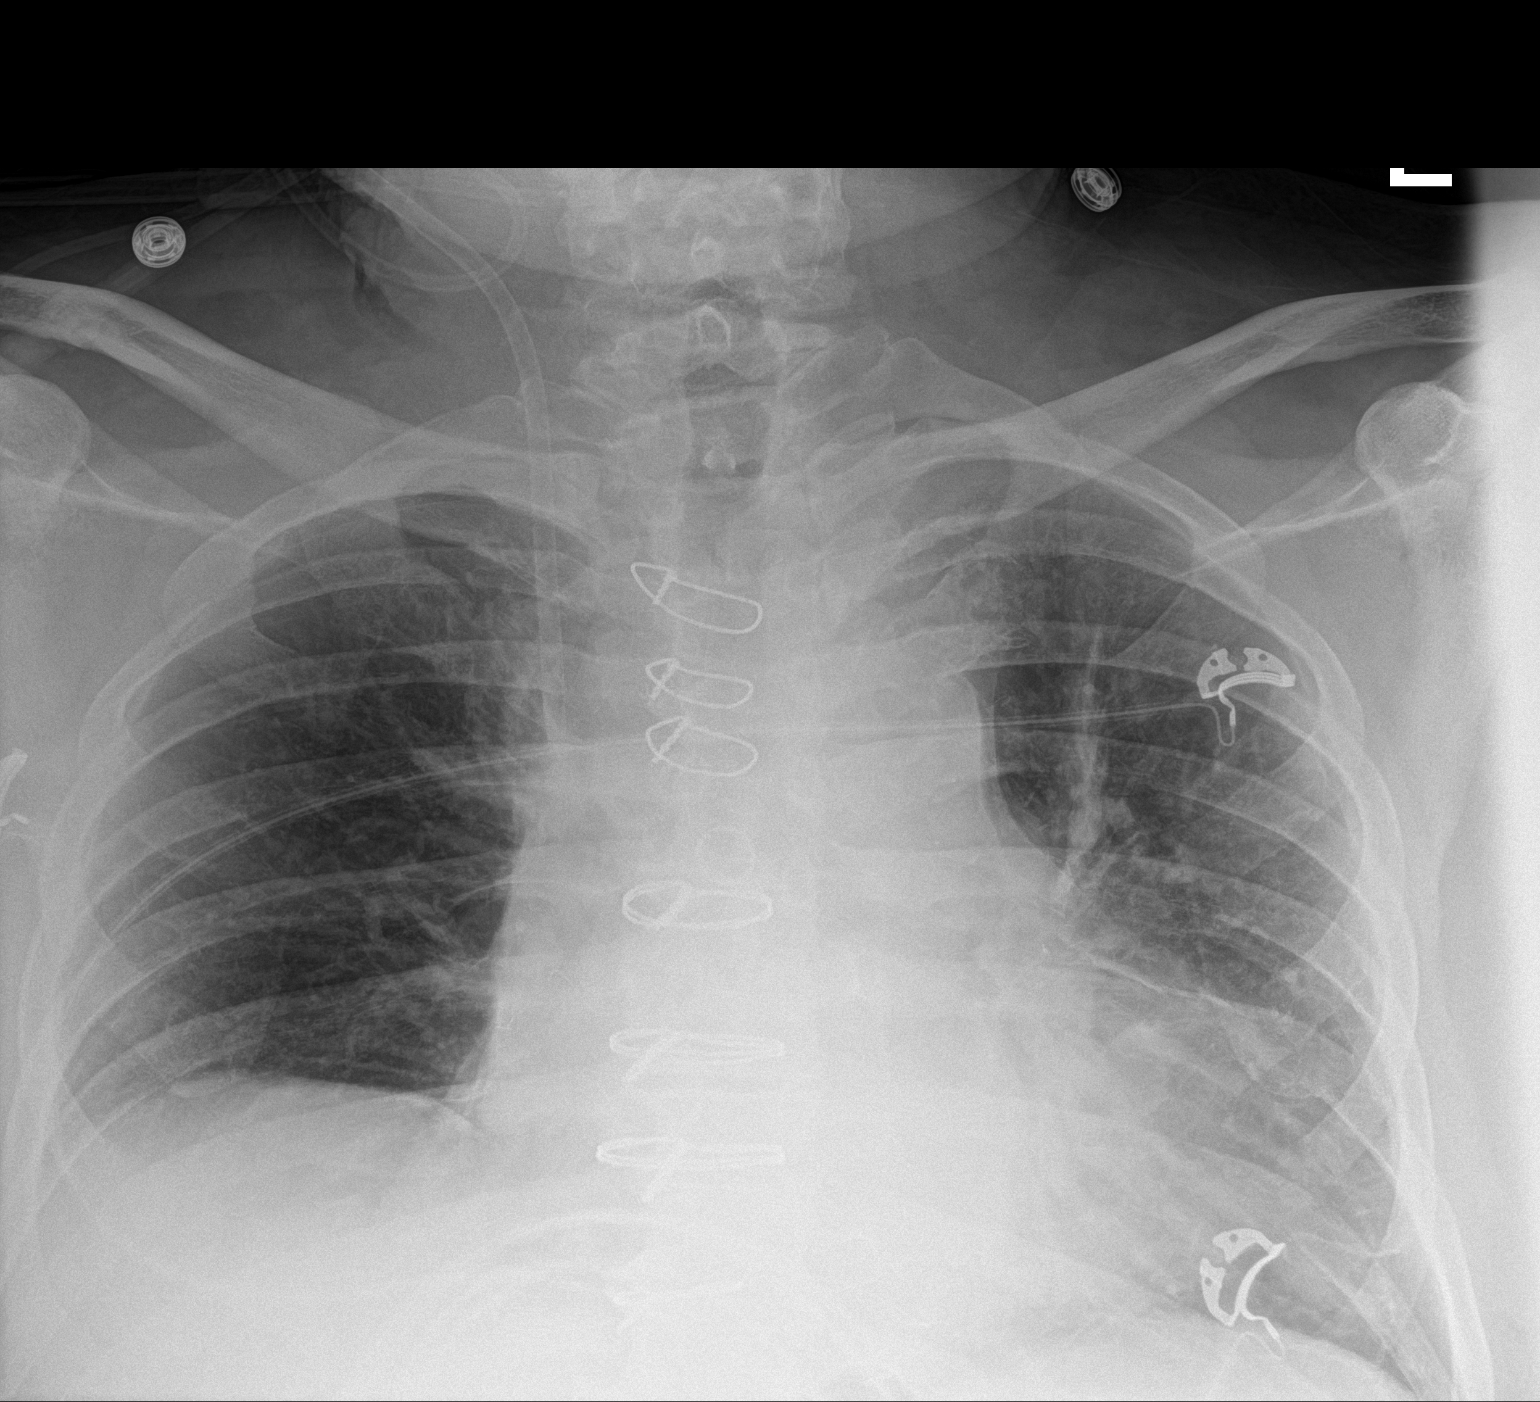

[1 of 1 positions shown; findings below may reference images not displayed]

FINDINGS: Stable cardiomegaly. Status post coronary bypass graft. No pleural
effusion is noted. Right internal jugular venous sheath is noted.
Left-sided chest tube has been removed. No left-sided pneumothorax
is noted., But minimal right apical pneumothorax is noted which is
unchanged compared to prior exam. Lungs are clear. Bony thorax is
unremarkable.
IMPRESSION: Left-sided chest tube has been removed. No left-sided pneumothorax
is noted, but minimal right apical pneumothorax is noted which is
unchanged compared to prior exam. These results will be called to
the ordering clinician or representative by the Radiologist
Assistant, and communication documented in the PACS or zVision
Dashboard.

## 2021-10-05 ENCOUNTER — Other Ambulatory Visit: Payer: Self-pay | Admitting: Interventional Cardiology

## 2021-11-05 ENCOUNTER — Other Ambulatory Visit: Payer: Self-pay | Admitting: Interventional Cardiology

## 2021-11-18 ENCOUNTER — Other Ambulatory Visit: Payer: Self-pay | Admitting: Interventional Cardiology

## 2021-11-27 ENCOUNTER — Other Ambulatory Visit: Payer: Self-pay | Admitting: Interventional Cardiology

## 2022-01-13 ENCOUNTER — Other Ambulatory Visit: Payer: Self-pay | Admitting: Interventional Cardiology

## 2022-01-28 NOTE — Progress Notes (Unsigned)
Cardiology Office Note:    Date:  01/29/2022   ID:  [name], DOB 02-27-1961, MRN [id number]  PCP:  Patient, No Pcp Per  Cardiologist:  Lesleigh Noe, MD   Referring MD: No ref. provider found   Chief Complaint  Patient presents with   Coronary Artery Disease   Congestive Heart Failure    History of Present Illness:    [name] is a 61 y.o. male with a hx of  primary hypertension, hyperlipidemia, acute combined systolic and diastolic heart failure, obesity, anterior STEMI, and emergency CABG with LIMA to LAD and saphenous vein graft to diagonal Sep 19, 2019  [name] is doing well.  He reminds me that his father died 5 years ago, his mother died in 09-16-2022, his only son was murdered 2 years ago.  He has been under a lot of stress related to that.  No chest pain episodes.  He does feel that his exertional tolerance is less than it was prior to his coronary bypass operation 2 years ago.  He has not needed nitroglycerin, denies dyspnea on exertion, and has not had orthopnea or PND.  Past Medical History:  Diagnosis Date   Coronary artery disease    GERD (gastroesophageal reflux disease)    "depending on what I eat."   Hyperlipidemia    Hypertension    Myocardial infarction (HCC)    anterior Stemi   PONV (postoperative nausea and vomiting)    ? anesthesia or Morphine    Past Surgical History:  Procedure Laterality Date   CARDIAC CATHETERIZATION     CORONARY ARTERY BYPASS GRAFT N/A 09/19/2019   Procedure: CORONARY ARTERY BYPASS GRAFTING (CABG) using LIMA to LAD; Endoscopic Right Greater Saphenous Vein Harvest: SVG to Diag.;  Surgeon: Delight Ovens, MD;  Location: [facility] OR;  Service: Open Heart Surgery;  Laterality: N/A;   CORONARY BALLOON ANGIOPLASTY N/A 09/19/2019   Procedure: CORONARY BALLOON ANGIOPLASTY;  Surgeon: Lyn Records, MD;  Location: [facility] INVASIVE CV LAB;  Service: Cardiovascular;  Laterality: N/A;   ENDOVEIN HARVEST OF GREATER SAPHENOUS VEIN Right 09/19/2019    Procedure: Mack Guise Of Greater Saphenous Vein;  Surgeon: Delight Ovens, MD;  Location: [facility];  Service: Open Heart Surgery;  Laterality: Right;   IABP INSERTION N/A 09/19/2019   Procedure: IABP Insertion;  Surgeon: Lyn Records, MD;  Location: [facility] INVASIVE CV LAB;  Service: Cardiovascular;  Laterality: N/A;   LEFT HEART CATH AND CORONARY ANGIOGRAPHY N/A 09/19/2019   Procedure: LEFT HEART CATH AND CORONARY ANGIOGRAPHY;  Surgeon: Lyn Records, MD;  Location: [facility] INVASIVE CV LAB;  Service: Cardiovascular;  Laterality: N/A;   TEE WITHOUT CARDIOVERSION N/A 09/19/2019   Procedure: TRANSESOPHAGEAL ECHOCARDIOGRAM (TEE);  Surgeon: Delight Ovens, MD;  Location: [facility] OR;  Service: Open Heart Surgery;  Laterality: N/A;   THROMBECTOMY BRACHIAL ARTERY Right 10/14/2019   Procedure: THROMBECTOMY RIGHT RADIAL ARTERY;  Surgeon: Maeola Harman, MD;  Location: [facility] OR;  Service: Vascular;  Laterality: Right;    Current Medications: Current Meds  Medication Sig   acetaminophen (TYLENOL) 325 MG tablet Take 2 tablets (650 mg total) by mouth every 6 (six) hours as needed for mild pain.   aspirin EC 81 MG EC tablet Take 1 tablet (81 mg total) by mouth daily.   atorvastatin (LIPITOR) 80 MG tablet Take 1 tablet (80 mg total) by mouth daily.   carvedilol (COREG) 12.5 MG tablet TAKE 1 TABLET (12.5MG  TOTAL) BY MOUTH TWICE A DAY  WITH MEALS   clopidogrel (PLAVIX) 75 MG tablet Take 1 tablet (75 mg total) by mouth daily.   lisinopril (ZESTRIL) 2.5 MG tablet Take 1 tablet (2.5 mg total) by mouth daily.     Allergies:   Morphine and related   Social History   Socioeconomic History   Marital status: Married    Spouse name: Not on file   Number of children: Not on file   Years of education: 12   Highest education level: High school graduate  Occupational History   Not on file  Tobacco Use   Smoking status: Former    Years: 23.00    Types: Cigarettes    Quit date: 2001    Years since quitting:  22.7   Smokeless tobacco: Never  Vaping Use   Vaping Use: Never used  Substance and Sexual Activity   Alcohol use: Yes    Comment: occasional   Drug use: Never   Sexual activity: Not on file  Other Topics Concern   Not on file  Social History Narrative   Not on file   Social Determinants of Health   Financial Resource Strain: Not on file  Food Insecurity: Not on file  Transportation Needs: Not on file  Physical Activity: Not on file  Stress: Not on file  Social Connections: Not on file     Family History: The patient's family history includes Cancer in his maternal grandmother; Diabetes Mellitus I in his brother; Fibromyalgia in his father, paternal grandmother, and sister; Heart disease in his brother and father; Hypertension in his maternal grandmother, mother, and paternal grandmother; Lung cancer in his maternal grandfather; Pancreatic cancer in his maternal grandfather.  ROS:   Please see the history of present illness.    He denies orthopnea, insomnia, claudication, and neurological symptoms.  All other systems reviewed and are negative.  EKGs/Labs/Other Studies Reviewed:    The following studies were reviewed today: 2D Doppler echocardiogram 03/13/2020: IMPRESSIONS     1. Left ventricular ejection fraction, by estimation, is 65 to 70%. The  left ventricle has normal function. The left ventricle has no regional  wall motion abnormalities. There is mild left ventricular hypertrophy.  Left ventricular diastolic parameters  are consistent with Grade I diastolic dysfunction (impaired relaxation).   2. Right ventricular systolic function is normal. The right ventricular  size is normal. There is normal pulmonary artery systolic pressure. The  estimated right ventricular systolic pressure is 62.2 mmHg.   3. The mitral valve is grossly normal. Trivial mitral valve  regurgitation.   4. The aortic valve is tricuspid. Aortic valve regurgitation is not  visualized.   5.  The inferior vena cava is normal in size with greater than 50%  respiratory variability, suggesting right atrial pressure of 3 mmHg.   Comparison(s): Changes from prior study are noted. 09/19/2019: LVEF 40% by  TEE.   EKG:  EKG normal sinus rhythm, first-degree AV block, left atrial abnormality.  Otherwise normal.  Recent Labs: No results found for requested labs within last 365 days.  Recent Lipid Panel    Component Value Date/Time   CHOL 121 06/05/2020 0749   TRIG 67 06/05/2020 0749   HDL 40 06/05/2020 0749   CHOLHDL 3.0 06/05/2020 0749   CHOLHDL 4.7 09/19/2019 0644   VLDL 29 09/19/2019 0644   LDLCALC 67 06/05/2020 0749    Physical Exam:    VS:  BP (!) 160/100   Pulse 68   Ht 5\' 10"  (1.778 m)   Wt  209 lb 3.2 oz (94.9 kg)   SpO2 96%   BMI 30.02 kg/m     Wt Readings from Last 3 Encounters:  01/29/22 209 lb 3.2 oz (94.9 kg)  09/16/20 216 lb 3.2 oz (98.1 kg)  03/20/20 207 lb 12.8 oz (94.3 kg)     GEN: Fully bearded. No acute distress HEENT: Normal NECK: No JVD. LYMPHATICS: No lymphadenopathy CARDIAC: No murmur. RRR no gallop, or edema. VASCULAR:  Normal Pulses. No bruits. RESPIRATORY:  Clear to auscultation without rales, wheezing or rhonchi  ABDOMEN: Soft, non-tender, non-distended, No pulsatile mass, MUSCULOSKELETAL: No deformity  SKIN: Warm and dry NEUROLOGIC:  Alert and oriented x 3 PSYCHIATRIC:  Normal affect   ASSESSMENT:    1. Coronary artery disease involving coronary bypass graft of native heart without angina pectoris   2. Hyperlipidemia, unspecified hyperlipidemia type   3. Essential hypertension   4. Radial artery occlusion, right (HCC)    PLAN:    In order of problems listed above:  Secondary prevention discussed. Continue high intensity statin therapy.  No recent laboratory data.  Last LDL measured was in 2022, February and was 67. Blood pressures are elevated.  States his blood pressure is always elevated in the doctor's office.  We will  plan to do an exercise treadmill test with him on his medication regimen of carvedilol and lisinopril.  If we will not uptitrate antihypertensive coverage, we should probably not further increase carvedilol because he is resting first-degree AV block.  He is in need to add a diuretic or increase ACE inhibitor intensity.  Overall education and awareness concerning primary/secondary risk prevention was discussed in detail: LDL less than 70, hemoglobin A1c less than 7, blood pressure target less than 130/80 mmHg, >150 minutes of moderate aerobic activity per week, avoidance of smoking, weight control (via diet and exercise), and continued surveillance/management of/for obstructive sleep apnea.     Medication Adjustments/Labs and Tests Ordered: Current medicines are reviewed at length with the patient today.  Concerns regarding medicines are outlined above.  Orders Placed This Encounter  Procedures   Lipid panel   Comprehensive metabolic panel   Hemoglobin A1c   Cardiac Stress Test: Informed Consent Details: Physician/Practitioner Attestation; Transcribe to consent form and obtain patient signature   EXERCISE TOLERANCE TEST (ETT)   EKG 12-Lead   No orders of the defined types were placed in this encounter.   Patient Instructions  Medication Instructions:  Your physician recommends that you continue on your current medications as directed. Please refer to the Current Medication list given to you today.  *If you need a refill on your cardiac medications before your next appointment, please call your pharmacy*  Lab Work: Same day as stress test (fasting): Lipid panel, CMET, Hgb A1c.  Testing/Procedures: Your physician has requested that you have an exercise tolerance test. For further information please visit [email or web address] Please also follow instruction sheet, as given.   Follow-Up: At [facility], you and your health needs are our priority.  As part of our continuing  mission to provide you with exceptional heart care, we have created designated Provider Care Teams.  These Care Teams include your primary Cardiologist (physician) and Advanced Practice Providers (APPs -  Physician Assistants and Nurse Practitioners) who all work together to provide you with the care you need, when you need it.  Your next appointment:   1 year(s)  The format for your next appointment:   In Person  Provider:   Lesleigh Noe, MD  Other Instructions Instructions for Exercise Treadmill Test  Please arrive 15 minutes prior to your appointment time for registration and insurance purposes.  The test will take approximately 45 minutes to complete.  How to prepare for your Exercise Stress Test: Do bring a list of your current medications with you.  If not listed below, you may take your medications as normal. Be sure to take your carvedilol and lisinopril the morning of your stress test. Do wear comfortable clothes (no dresses or overalls) and walking shoes, tennis shoes preferred (no heels or open toed shoes are allowed) Do Not wear cologne, perfume, aftershave or lotions (deodorant is allowed). Please report to [address] for your test.  If these instructions are not followed, your test will have to be rescheduled.  If you have questions or concerns about your appointment, you can call the Stress Lab at [phone].  If you cannot keep your appointment, please provide 24 hours notification to the Stress Lab, to avoid a possible $50 charge to your account.  Important Information About Sugar         Signed, Lesleigh Noe, MD  01/29/2022 12:32 PM    [facility]

## 2022-01-29 ENCOUNTER — Encounter: Payer: Self-pay | Admitting: Interventional Cardiology

## 2022-01-29 ENCOUNTER — Ambulatory Visit: Payer: 59 | Attending: Interventional Cardiology | Admitting: Interventional Cardiology

## 2022-01-29 VITALS — BP 160/100 | HR 68 | Ht 70.0 in | Wt 209.2 lb

## 2022-01-29 DIAGNOSIS — I25810 Atherosclerosis of coronary artery bypass graft(s) without angina pectoris: Secondary | ICD-10-CM

## 2022-01-29 DIAGNOSIS — E785 Hyperlipidemia, unspecified: Secondary | ICD-10-CM | POA: Diagnosis not present

## 2022-01-29 DIAGNOSIS — I70208 Unspecified atherosclerosis of native arteries of extremities, other extremity: Secondary | ICD-10-CM | POA: Diagnosis not present

## 2022-01-29 DIAGNOSIS — I10 Essential (primary) hypertension: Secondary | ICD-10-CM

## 2022-01-29 NOTE — Patient Instructions (Addendum)
Medication Instructions:  Your physician recommends that you continue on your current medications as directed. Please refer to the Current Medication list given to you today.  *If you need a refill on your cardiac medications before your next appointment, please call your pharmacy*  Lab Work: Same day as stress test (fasting): Lipid panel, CMET, Hgb A1c.  Testing/Procedures: Your physician has requested that you have an exercise tolerance test. For further information please visit [email or web address]. Please also follow instruction sheet, as given.   Follow-Up: At [facility], you and your health needs are our priority.  As part of our continuing mission to provide you with exceptional heart care, we have created designated Provider Care Teams.  These Care Teams include your primary Cardiologist (physician) and Advanced Practice Providers (APPs -  Physician Assistants and Nurse Practitioners) who all work together to provide you with the care you need, when you need it.  Your next appointment:   1 year(s)  The format for your next appointment:   In Person  Provider:   Sinclair Grooms, MD   Other Instructions Instructions for Exercise Treadmill Test  Please arrive 15 minutes prior to your appointment time for registration and insurance purposes.  The test will take approximately 45 minutes to complete.  How to prepare for your Exercise Stress Test: Do bring a list of your current medications with you.  If not listed below, you may take your medications as normal. Be sure to take your carvedilol and lisinopril the morning of your stress test. Do wear comfortable clothes (no dresses or overalls) and walking shoes, tennis shoes preferred (no heels or open toed shoes are allowed) Do Not wear cologne, perfume, aftershave or lotions (deodorant is allowed). Please report to [address], [address] for your test.  If these instructions are not followed, your test will  have to be rescheduled.  If you have questions or concerns about your appointment, you can call the Stress Lab at [phone].  If you cannot keep your appointment, please provide 24 hours notification to the Stress Lab, to avoid a possible $50 charge to your account.  Important Information About Sugar

## 2022-02-16 NOTE — Progress Notes (Signed)
Exercise Stress Test patient instructions.

## 2022-03-03 ENCOUNTER — Ambulatory Visit: Payer: 59 | Attending: Interventional Cardiology

## 2022-03-03 DIAGNOSIS — I25810 Atherosclerosis of coronary artery bypass graft(s) without angina pectoris: Secondary | ICD-10-CM

## 2022-03-03 DIAGNOSIS — I10 Essential (primary) hypertension: Secondary | ICD-10-CM

## 2022-03-03 LAB — EXERCISE TOLERANCE TEST
Angina Index: 0
Duke Treadmill Score: 8
Estimated workload: 10.1
Exercise duration (min): 8 min
Exercise duration (sec): 0 s
MPHR: 159 {beats}/min
Peak HR: 137 {beats}/min
Percent HR: 86 %
RPE: 16
Rest HR: 82 {beats}/min
ST Depression (mm): 0 mm

## 2022-03-04 ENCOUNTER — Ambulatory Visit: Payer: 59 | Attending: Interventional Cardiology

## 2022-03-04 ENCOUNTER — Other Ambulatory Visit: Payer: Self-pay | Admitting: Interventional Cardiology

## 2022-03-04 ENCOUNTER — Telehealth: Payer: Self-pay | Admitting: Interventional Cardiology

## 2022-03-04 DIAGNOSIS — E785 Hyperlipidemia, unspecified: Secondary | ICD-10-CM

## 2022-03-04 DIAGNOSIS — I25810 Atherosclerosis of coronary artery bypass graft(s) without angina pectoris: Secondary | ICD-10-CM

## 2022-03-04 DIAGNOSIS — I10 Essential (primary) hypertension: Secondary | ICD-10-CM

## 2022-03-04 MED ORDER — LISINOPRIL-HYDROCHLOROTHIAZIDE 10-12.5 MG PO TABS: 1.0000 | ORAL_TABLET | Freq: Every day | ORAL | 3 refills | Status: DC

## 2022-03-04 NOTE — Telephone Encounter (Signed)
Called and discussed stress test results with patient.  Per Dr. Katrinka Blazing: Let the patient know BP response was hypertensive. Did he take medications on morning of procedure? Please change lisinopril to Lisinopril HCTZ 10/12.5 mg daily. BP clinic f/u 1 month with BMET   Lisinopril-HCTZ 10-12.5mg  QD sent to [facility] pharmacy, called and cancelled refill on Lisinopril 2.5mg .  BMET ordered, to be drawn at HTN clinic appt in 1 month. Referral placed. Scheduler to call and setup appt with patient.  Patient verbalized understanding of the above and expressed appreciation for call.

## 2022-03-04 NOTE — Telephone Encounter (Signed)
-----   Message from Lyn Records, MD sent at 03/04/2022  2:18 PM EST ----- Let the patient know BP response was hypertensive. Did he take medications on morning of procedure? Please change lisinopril to Lisinopril HCTZ 10/12.5 mg daily. BP clinic f/u 1 month with BMET A copy will be sent to Patrcia Dolly, DO

## 2022-03-05 LAB — COMPREHENSIVE METABOLIC PANEL
ALT: 30 IU/L (ref 0–44)
AST: 25 IU/L (ref 0–40)
Albumin/Globulin Ratio: 2.3 — ABNORMAL HIGH (ref 1.2–2.2)
Albumin: 4.2 g/dL (ref 3.9–4.9)
Alkaline Phosphatase: 72 IU/L (ref 44–121)
BUN/Creatinine Ratio: 19 (ref 10–24)
BUN: 15 mg/dL (ref 8–27)
Bilirubin Total: 0.4 mg/dL (ref 0.0–1.2)
CO2: 25 mmol/L (ref 20–29)
Calcium: 9.1 mg/dL (ref 8.6–10.2)
Chloride: 105 mmol/L (ref 96–106)
Creatinine, Ser: 0.79 mg/dL (ref 0.76–1.27)
Globulin, Total: 1.8 g/dL (ref 1.5–4.5)
Glucose: 88 mg/dL (ref 70–99)
Potassium: 4.6 mmol/L (ref 3.5–5.2)
Sodium: 141 mmol/L (ref 134–144)
Total Protein: 6 g/dL (ref 6.0–8.5)
eGFR: 101 mL/min/{1.73_m2} (ref >59)

## 2022-03-05 LAB — LIPID PANEL
Chol/HDL Ratio: 2.9 ratio (ref 0.0–5.0)
Cholesterol, Total: 116 mg/dL (ref 100–199)
HDL: 40 mg/dL (ref >39)
LDL Chol Calc (NIH): 64 mg/dL (ref 0–99)
Triglycerides: 54 mg/dL (ref 0–149)
VLDL Cholesterol Cal: 12 mg/dL (ref 5–40)

## 2022-03-05 LAB — HEMOGLOBIN A1C
Est. average glucose Bld gHb Est-mCnc: 128 mg/dL
Hgb A1c MFr Bld: 6.1 % — ABNORMAL HIGH (ref 4.8–5.6)

## 2022-04-21 ENCOUNTER — Ambulatory Visit: Payer: 59 | Attending: Cardiovascular Disease | Admitting: Pharmacist

## 2022-04-21 VITALS — BP 142/82 | HR 60

## 2022-04-21 DIAGNOSIS — I10 Essential (primary) hypertension: Secondary | ICD-10-CM | POA: Diagnosis not present

## 2022-04-21 NOTE — Assessment & Plan Note (Signed)
Assessment: BP is uncontrolled in office BP 142/ 82 mmHg above the goal (<130/80). 1 home reading is at goal, no other readings available Tolerates medications well without any side effects but does worry about side effects if dose was to be increased.  Denies SOB, palpitation, chest pain, headaches,or swelling Does not consume an excess of caffeine Reiterated the importance of regular exercise and low salt diet  Patient reports whitecoat syndrome  Plan:  Continue taking carvedilol 12.5mg  twice a day, lisinopril/HCTZ 10/12.5 mg daily Patient to keep record of BP readings with heart rate and report to [place] at the next visit I have asked patient to bring in his home blood pressure cuff and log to next visit Follow-up the middle of February due to patient being out of town for [employer] competition. Will check a BMP today it has not been checked since starting HCTZ and increasing the dose of lisinopril Will call patient tomorrow with lab results and any medication changes if needed based on labs.  Do not plan to change medication dose based off of blood pressure reading today in clinic since patient reports whitecoat hypertension and we only have 1 home reading to go off of which was at goal Explained to patient importance of checking blood pressure at home and he will bring his readings to next visit

## 2022-04-21 NOTE — Patient Instructions (Addendum)
Summary of today's discussion  1.Please start checking blood pressure at home. Try for once a day  2.Continue carvedilol 12.5mg  twice a day, lisinopril/HCTZ 10/12.5 mg daily  3. We will call you with your lab results tomorrow  4.Please bring your blood pressure cuff and list of reading with you to your next appointment  5.   Your blood pressure goal is <130/80  To check your pressure at home you will need to:  1. Sit up in a chair, with feet flat on the floor and back supported. Do not cross your ankles or legs. 2. Rest your left arm so that the cuff is about heart level. If the cuff goes on your upper arm,  then just relax the arm on the table, arm of the chair or your lap. If you have a wrist cuff, we  suggest relaxing your wrist against your chest (think of it as Pledging the Flag with the  wrong arm).  3. Place the cuff snugly around your arm, about 1 inch above the crook of your elbow. The  cords should be inside the groove of your elbow.  4. Sit quietly, with the cuff in place, for about 5 minutes. After that 5 minutes press the power  button to start a reading. 5. Do not talk or move while the reading is taking place.  6. Record your readings on a sheet of paper. Although most cuffs have a memory, it is often  easier to see a pattern developing when the numbers are all in front of you.  7. You can repeat the reading after 1-3 minutes if it is recommended  Make sure your bladder is empty and you have not had caffeine or tobacco within the last 30 min  Always bring your blood pressure log with you to your appointments. If you have not brought your monitor in to be double checked for accuracy, please bring it to your next appointment.  You can find a list of validated (accurate) blood pressure cuffs at [email or web address]   Important lifestyle changes to control high blood pressure  Intervention  Effect on the BP  Lose extra pounds and watch your waistline Weight loss is one of  the most effective lifestyle changes for controlling blood pressure. If you're overweight or obese, losing even a small amount of weight can help reduce blood pressure. Blood pressure might go down by about 1 millimeter of mercury (mm Hg) with each kilogram (about 2.2 pounds) of weight lost.  Exercise regularly As a general goal, aim for at least 30 minutes of moderate physical activity every day. Regular physical activity can lower high blood pressure by about 5 to 8 mm Hg.  Eat a healthy diet Eating a diet rich in whole grains, fruits, vegetables, and low-fat dairy products and low in saturated fat and cholesterol. A healthy diet can lower high blood pressure by up to 11 mm Hg.  Reduce salt (sodium) in your diet Even a small reduction of sodium in the diet can improve heart health and reduce high blood pressure by about 5 to 6 mm Hg.  Limit alcohol One drink equals 12 ounces of beer, 5 ounces of wine, or 1.5 ounces of 80-proof liquor.  Limiting alcohol to less than one drink a day for women or two drinks a day for men can help lower blood pressure by about 4 mm Hg.   Please call me at [phone] with any questions.

## 2022-04-21 NOTE — Progress Notes (Signed)
Patient ID: [name]                 DOB: Nov 12, 1960                      MRN: [id number]      HPI: [name] is a 61 y.o. male referred by Dr. Tamala Julian to HTN clinic. 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 Patient was seen by Dr. Tamala Julian in October.  Blood pressure was elevated at that appointment and a exercise treadmill test was ordered.  Stress test showed hypertensive response and lisinopril 2.5 was changed to lisinopril/HCTZ 10/12.5 daily patient was referred to blood pressure clinic.  Patient reports feeling much better.  States he can tell his blood pressure is better controlled.  Does not often remember to check his blood pressure but did check it the other day was 127/77.  Denies any dizziness lightheadedness headache chest pain or shortness of breath.  He only does formal exercise by walking about twice a week but does have a physically active job.  He reports stomach cramps for about a week and a half when he first started the lisinopril/HCTZ but this is since gone away.  Current HTN meds: carvedilol 12.5mg  twice a day, lisinopril/HCTZ 10/12.5 mg daily Previously tried:  BP goal: <130/80  Family History:  Family History  Problem Relation Age of Onset   Hypertension Mother    Heart disease Father    Fibromyalgia Father    Fibromyalgia Sister    Heart disease Brother    Diabetes Mellitus I Brother    Hypertension Maternal Grandmother    Cancer Maternal Grandmother    Pancreatic cancer Maternal Grandfather    Lung cancer Maternal Grandfather    Hypertension Paternal Grandmother    Fibromyalgia Paternal Grandmother     Social History: ETOH once per month, no tobacco quit years ago  Diet: 1 cup coffee every AM, no soft drinks, no red meat except occasional venison  Exercise:   Home BP  readings:  Date SBP/DBP  HR  04/16/22 127/77                           Average      Wt Readings from Last 3 Encounters:  01/29/22 209 lb 3.2 oz (94.9 kg)  09/16/20 216 lb 3.2 oz (98.1 kg)  03/20/20 207 lb 12.8 oz (94.3 kg)   BP Readings from Last 3 Encounters:  04/21/22 (!) 142/82  01/29/22 (!) 160/100  09/16/20 (!) 156/78   Pulse Readings from Last 3 Encounters:  04/21/22 60  01/29/22 68  09/16/20 64    Renal function: CrCl cannot be calculated (Patient's most recent lab result is older than the maximum 21 days allowed.).  Past Medical History:  Diagnosis Date   Coronary artery disease    GERD (gastroesophageal reflux disease)    "depending on what I eat."   Hyperlipidemia    Hypertension    Myocardial infarction ([place])    anterior Stemi   PONV (postoperative nausea and vomiting)    ? anesthesia or Morphine    Current Outpatient Medications on File Prior to Visit  Medication Sig Dispense Refill   aspirin EC 81 MG EC tablet Take 1 tablet (81 mg total) by mouth daily.  atorvastatin (LIPITOR) 80 MG tablet TAKE 1 TABLET BY MOUTH EVERY DAY 90 tablet 3   carvedilol (COREG) 12.5 MG tablet TAKE 1 TABLET (12.5MG  TOTAL) BY MOUTH TWICE A DAY WITH MEALS 180 tablet 3   clopidogrel (PLAVIX) 75 MG tablet TAKE 1 TABLET BY MOUTH EVERY DAY 90 tablet 3   lisinopril-hydrochlorothiazide (ZESTORETIC) 10-12.5 MG tablet Take 1 tablet by mouth daily. 90 tablet 3   acetaminophen (TYLENOL) 325 MG tablet Take 2 tablets (650 mg total) by mouth every 6 (six) hours as needed for mild pain.     No current facility-administered medications on file prior to visit.    Allergies  Allergen Reactions   Morphine And Related Nausea And Vomiting    Blood pressure (!) 142/82, pulse 60, SpO2 97 %.   Assessment/Plan:  1. Hypertension -  Hypertension Assessment: BP is uncontrolled in office BP 142/ 82 mmHg above the goal (<130/80). 1 home reading is at goal, no other readings  available Tolerates medications well without any side effects but does worry about side effects if dose was to be increased.  Denies SOB, palpitation, chest pain, headaches,or swelling Does not consume an excess of caffeine Reiterated the importance of regular exercise and low salt diet  Patient reports whitecoat syndrome  Plan:  Continue taking carvedilol 12.5mg  twice a day, lisinopril/HCTZ 10/12.5 mg daily Patient to keep record of BP readings with heart rate and report to [place] at the next visit I have asked patient to bring in his home blood pressure cuff and log to next visit Follow-up the middle of February due to patient being out of town for [employer] competition. Will check a BMP today it has not been checked since starting HCTZ and increasing the dose of lisinopril Will call patient tomorrow with lab results and any medication changes if needed based on labs.  Do not plan to change medication dose based off of blood pressure reading today in clinic since patient reports whitecoat hypertension and we only have 1 home reading to go off of which was at goal Explained to patient importance of checking blood pressure at home and he will bring his readings to next visit   Thank you  Ramond Dial, Pharm.D, BCPS, CPP [facility] [place] [address], [place],  [address]  Phone: [phone]; Fax: [phone]

## 2022-04-22 LAB — BASIC METABOLIC PANEL
BUN/Creatinine Ratio: 19 (ref 10–24)
BUN: 15 mg/dL (ref 8–27)
CO2: 25 mmol/L (ref 20–29)
Calcium: 9 mg/dL (ref 8.6–10.2)
Chloride: 103 mmol/L (ref 96–106)
Creatinine, Ser: 0.77 mg/dL (ref 0.76–1.27)
Glucose: 105 mg/dL — ABNORMAL HIGH (ref 70–99)
Potassium: 4.6 mmol/L (ref 3.5–5.2)
Sodium: 140 mmol/L (ref 134–144)
eGFR: 102 mL/min/{1.73_m2} (ref >59)

## 2022-06-14 NOTE — Progress Notes (Unsigned)
Patient ID: [name]                 DOB: 1961-02-18                      MRN: [id number]      HPI: [name] is a 62 y.o. male referred by Dr. Tamala Julian to HTN clinic. 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 Patient was seen by Dr. Tamala Julian in October.  Blood pressure was elevated at that appointment and a exercise treadmill test was ordered.  Stress test showed hypertensive response and lisinopril 2.5 was changed to lisinopril/HCTZ 10/12.5 daily patient was referred to blood pressure clinic.follow up BMP was normal. At the last visit with PharmD patient were advised to check BP at home every day and bring BP monitor and log at the follow up appointment in 4 weeks.  Patient presented today with his BP monitor and last 4 weeks of BP readings. His home BP ~120/75 with heart rate 75-80. Reports he feels well. He takes his BP medications regularly and tolerates them well without side effects. He eats healthy diet and does not add salt to his food. Eats fish and chicken mostly air fried, broiled or grilled. He goes for walks with his wife every day as long as it is not too cold outside. He is mechanical contractor,his work is heavy duty. Validated home cuff found out to be accurate.   SBP/DBP  HR  1st on home cuff  144/89 75  1st manual  140/85 73  2nd on home cuff 138/86 77  2nd manual  140/80 70     Current HTN meds: carvedilol 12.84m twice a day, lisinopril/HCTZ 10/12.5 mg daily Previously tried and not tolerated medications: none  BP goal: <130/80   Family History  Problem Relation Age of Onset   Hypertension Mother    Heart disease Father    Fibromyalgia Father    Fibromyalgia Sister    Heart disease Brother    Diabetes Mellitus I Brother    Hypertension Maternal Grandmother    Cancer Maternal  Grandmother    Pancreatic cancer Maternal Grandfather    Lung cancer Maternal Grandfather    Hypertension Paternal Grandmother    Fibromyalgia Paternal Grandmother     Social History: ETOH once per month, no tobacco quit years ago  Diet: Does not add salt to his food, does not eat fried food, meat- grilled or air fried Eats out once week    Exercise: walk every evening most days of the week, no resistance exercise   Home BP readings:   SBP DBP  2/10 122 78  2/11 116 70  2/12 120 78  2/13 122 77  2/14 117 72  2/15 121 73  2/16 129 67  2/17 115 78  2/18 108 77  2/19 119 79  Average  118.9 74.9     Wt Readings from Last 3 Encounters:  01/29/22 209 lb 3.2 oz (94.9 kg)  09/16/20 216 lb 3.2 oz (98.1 kg)  03/20/20 207 lb 12.8 oz (94.3 kg)   BP Readings from Last 3 Encounters:  04/21/22 (!) 142/82  01/29/22 (!) 160/100  09/16/20 (!) 156/78   Pulse Readings from Last 3 Encounters:  04/21/22 60  01/29/22 68  09/16/20 64    Renal function:  CrCl cannot be calculated (Patient's most recent lab result is older than the maximum 21 days allowed.).  Past Medical History:  Diagnosis Date   Coronary artery disease    GERD (gastroesophageal reflux disease)    "depending on what I eat."   Hyperlipidemia    Hypertension    Myocardial infarction ([place])    anterior Stemi   PONV (postoperative nausea and vomiting)    ? anesthesia or Morphine    Current Outpatient Medications on File Prior to Visit  Medication Sig Dispense Refill   acetaminophen (TYLENOL) 325 MG tablet Take 2 tablets (650 mg total) by mouth every 6 (six) hours as needed for mild pain.     aspirin EC 81 MG EC tablet Take 1 tablet (81 mg total) by mouth daily.     atorvastatin (LIPITOR) 80 MG tablet TAKE 1 TABLET BY MOUTH EVERY DAY 90 tablet 3   carvedilol (COREG) 12.5 MG tablet TAKE 1 TABLET (12.5MG TOTAL) BY MOUTH TWICE A DAY WITH MEALS 180 tablet 3   clopidogrel (PLAVIX) 75 MG tablet TAKE 1 TABLET BY MOUTH  EVERY DAY 90 tablet 3   lisinopril-hydrochlorothiazide (ZESTORETIC) 10-12.5 MG tablet Take 1 tablet by mouth daily. 90 tablet 3   No current facility-administered medications on file prior to visit.    Allergies  Allergen Reactions   Morphine And Related Nausea And Vomiting    There were no vitals taken for this visit.   Hypertension Assessment: BP is uncontrolled (goal <130/80)  in office BP 140/85 mmHg, patient reports he has white coat syndrome Home BP ~120/75, home cuff validated today found out to be accurate Takes BP medications regularly and tolerates them well without any side effects Denies SOB, palpitation, chest pain, headaches,or swelling  Plan:  In setting of white coat syndrome and at goal home BP on validated arm cuff no need to make any changes to the current BP medications Continue taking carvedilol 12.54m twice a day, lisinopril/HCTZ 10/12.5 mg daily  No follow up with PharmD scheduled  Patient to keep record of BP readings and if it persistently stays above the goal call the office  Patient saw Dr.Smith in Sept 2023. On the call list for 1 year follow up  Follow up lab(s) :none   Thank you  VCammy Copa Pharm.D [facility] [place] [address] Phone: [phone] Fax: [phone]

## 2022-06-15 ENCOUNTER — Ambulatory Visit: Payer: 59 | Attending: Cardiovascular Disease | Admitting: Student

## 2022-06-15 VITALS — BP 138/86 | HR 70

## 2022-06-15 DIAGNOSIS — I10 Essential (primary) hypertension: Secondary | ICD-10-CM

## 2022-06-15 NOTE — Patient Instructions (Addendum)
No changes made by your pharmacist Cammy Copa, PharmD at today's visit:      [place] 5 minutes before taking your blood pressure.  Don't smoke or drink caffeinated beverages for at least 30 minutes before. Take your blood pressure before (not after) you eat. Sit comfortably with your back supported and both feet on the floor (don't cross your legs). Elevate your arm to heart level on a table or a desk. Use the proper sized cuff. It should fit smoothly and snugly around your bare upper arm. There should be enough room to slip a fingertip under the cuff. The bottom edge of the cuff should be 1 inch above the crease of the elbow. Ideally, take 3 measurements at one sitting and record the average.  Important lifestyle changes to control high blood pressure  Intervention  Effect on the BP  Lose extra pounds and watch your waistline Weight loss is one of the most effective lifestyle changes for controlling blood pressure. If you're overweight or obese, losing even a small amount of weight can help reduce blood pressure. Blood pressure might go down by about 1 millimeter of mercury (mm Hg) with each kilogram (about 2.2 pounds) of weight lost.  Exercise regularly As a general goal, aim for at least 30 minutes of moderate physical activity every day. Regular physical activity can lower high blood pressure by about 5 to 8 mm Hg.  Eat a healthy diet Eating a diet rich in whole grains, fruits, vegetables, and low-fat dairy products and low in saturated fat and cholesterol. A healthy diet can lower high blood pressure by up to 11 mm Hg.  Reduce salt (sodium) in your diet Even a small reduction of sodium in the diet can improve heart health and reduce high blood pressure by about 5 to 6 mm Hg.  Limit alcohol One drink equals 12 ounces of beer, 5 ounces of wine, or 1.5 ounces of 80-proof liquor.  Limiting alcohol to less than one drink a day for women or two drinks  a day for men can help lower blood pressure by about 4 mm Hg.   If you have any questions or concerns please use My Chart to send questions or call the office at [phone]

## 2022-06-15 NOTE — Assessment & Plan Note (Signed)
Assessment: BP is uncontrolled (goal <130/80)  in office BP 140/85 mmHg, patient reports he has white coat syndrome Home BP ~120/75, home cuff validated today found out to be accurate Takes BP medications regularly and tolerates them well without any side effects Denies SOB, palpitation, chest pain, headaches,or swelling  Plan:  In setting of white coat syndrome and at goal home BP on validated arm cuff no need to make any changes to the current BP medications Continue taking carvedilol 12.32m twice a day, lisinopril/HCTZ 10/12.5 mg daily  No follow up with PharmD scheduled  Patient to keep record of BP readings and if it persistently stays above the goal call the office  Patient saw Dr.Smith in Sept 2023. On the call list for 1 year follow up  Follow up lab(s) :none

## 2023-01-26 ENCOUNTER — Other Ambulatory Visit: Payer: Self-pay

## 2023-01-26 MED ORDER — CARVEDILOL 12.5 MG PO TABS: 12.5000 mg | ORAL_TABLET | Freq: Two times a day (BID) | ORAL | 0 refills | Status: DC

## 2023-02-23 ENCOUNTER — Other Ambulatory Visit: Payer: Self-pay | Admitting: Physician Assistant

## 2023-02-24 ENCOUNTER — Other Ambulatory Visit: Payer: Self-pay

## 2023-02-24 MED ORDER — CARVEDILOL 12.5 MG PO TABS: 12.5000 mg | ORAL_TABLET | Freq: Two times a day (BID) | ORAL | 0 refills | Status: DC

## 2023-02-24 MED ORDER — LISINOPRIL-HYDROCHLOROTHIAZIDE 10-12.5 MG PO TABS: 1.0000 | ORAL_TABLET | Freq: Every day | ORAL | 0 refills | Status: DC

## 2023-03-03 ENCOUNTER — Other Ambulatory Visit: Payer: Self-pay | Admitting: Physician Assistant

## 2023-03-05 ENCOUNTER — Other Ambulatory Visit: Payer: Self-pay

## 2023-03-05 MED ORDER — LISINOPRIL-HYDROCHLOROTHIAZIDE 10-12.5 MG PO TABS: 1.0000 | ORAL_TABLET | Freq: Every day | ORAL | 0 refills | Status: DC

## 2023-03-05 MED ORDER — CLOPIDOGREL BISULFATE 75 MG PO TABS: 75.0000 mg | ORAL_TABLET | Freq: Every day | ORAL | 0 refills | Status: DC

## 2023-03-09 ENCOUNTER — Telehealth: Payer: Self-pay | Admitting: Cardiovascular Disease

## 2023-03-09 MED ORDER — CLOPIDOGREL BISULFATE 75 MG PO TABS: 75.0000 mg | ORAL_TABLET | Freq: Every day | ORAL | 0 refills | Status: DC

## 2023-03-09 MED ORDER — ATORVASTATIN CALCIUM 80 MG PO TABS: 80.0000 mg | ORAL_TABLET | Freq: Every day | ORAL | 0 refills | Status: DC

## 2023-03-09 MED ORDER — CARVEDILOL 12.5 MG PO TABS: 12.5000 mg | ORAL_TABLET | Freq: Two times a day (BID) | ORAL | 0 refills | Status: DC

## 2023-03-09 MED ORDER — LISINOPRIL-HYDROCHLOROTHIAZIDE 10-12.5 MG PO TABS: 1.0000 | ORAL_TABLET | Freq: Every day | ORAL | 0 refills | Status: DC

## 2023-03-09 NOTE — Telephone Encounter (Signed)
*  STAT* If patient is at the pharmacy, call can be transferred to refill team.   1. Which medications need to be refilled? (please list name of each medication and dose if known)   atorvastatin (LIPITOR) 80 MG tablet  carvedilol (COREG) 12.5 MG tablet  clopidogrel (PLAVIX) 75 MG tablet  lisinopril-hydrochlorothiazide (ZESTORETIC) 10-12.5 MG tablet   2. Would you like to learn more about the convenience, safety, & potential cost savings by using the [facility]?   3. Are you open to using the [facility] (Type [facility]. ).  4. Which pharmacy/location (including street and city if local pharmacy) is medication to be sent to?  [id number] [place], [place] - [address]   5. Do they need a 30 day or 90 day supply?   90 day  Patient stated he still has some medication left.  Patient has appointment scheduled on  1/28.

## 2023-03-09 NOTE — Telephone Encounter (Signed)
Pt RX sent to Pt Pharmacy

## 2023-03-30 ENCOUNTER — Other Ambulatory Visit: Payer: Self-pay | Admitting: Cardiovascular Disease

## 2023-05-13 ENCOUNTER — Ambulatory Visit: Payer: 59 | Admitting: Cardiology

## 2023-05-24 ENCOUNTER — Encounter: Payer: Self-pay | Admitting: Cardiovascular Disease

## 2023-05-24 NOTE — Progress Notes (Signed)
Pt cancelled  This encounter was created in error - please disregard. 

## 2023-05-25 ENCOUNTER — Ambulatory Visit: Payer: 59 | Admitting: Cardiovascular Disease

## 2023-06-27 ENCOUNTER — Other Ambulatory Visit: Payer: Self-pay | Admitting: Cardiovascular Disease

## 2023-07-01 ENCOUNTER — Encounter: Payer: Self-pay | Admitting: Cardiovascular Disease

## 2023-07-01 NOTE — Progress Notes (Signed)
  Cardiology Office Note:  .   Date:  07/02/2023  ID:  [name], DOB 02/15/1961, MRN [id number] PCP: Patient, No Pcp Per  [facility] Providers Cardiologist:  Lesleigh Noe, MD (Inactive) {   History of Present Illness: .    May 25, 2023  Pt rescheduled  July 02, 2023   [name] is a 63 y.o. male with history of hypertension.  He is a previous patient of Dr. Katrinka Blazing.  I am meeting today for the first time.  He has a history of hypertension, hyperlipidemia, history of anterior ST segment elevation myocardial infarction with emergent CABG-LIMA to the LAD and SVG to the diagonal vessel in May, 2021  Has done well   No CP  Works every day ,  walks every day  Is a Visual merchandiser ,  welder   Encouraged him to walk more /   Had a viral illness, possibly flu   BP is normal at home . Has white coat HTN    ROS:   Studies Reviewed: .         Risk Assessment/Calculations:     HYPERTENSION CONTROL Vitals:   07/02/23 0901 07/02/23 0929  BP: (!) 158/88 (!) 150/88    The patient's blood pressure is elevated above target today.  In order to address the patient's elevated BP: Blood pressure will be monitored at home to determine if medication changes need to be made.          Physical Exam:   VS:  BP (!) 150/88   Pulse 77   Ht 5\' 11"  (1.803 m)   Wt 213 lb 3.2 oz (96.7 kg)   SpO2 98%   BMI 29.74 kg/m    Wt Readings from Last 3 Encounters:  07/02/23 213 lb 3.2 oz (96.7 kg)  01/29/22 209 lb 3.2 oz (94.9 kg)  09/16/20 216 lb 3.2 oz (98.1 kg)    GEN: Well nourished, well developed in no acute distress NECK: No JVD; No carotid bruits CARDIAC: RRR, no murmurs, rubs, gallops RESPIRATORY:  Clear to auscultation without rales, wheezing or rhonchi  ABDOMEN: Soft, non-tender, non-distended EXTREMITIES:  No edema; No deformity   ECG:   EKG Interpretation Date/Time:  Friday July 02 2023 09:04:46 EDT Ventricular Rate:  71 PR Interval:  204 QRS  Duration:  88 QT Interval:  382 QTC Calculation: 415 R Axis:   -2  Text Interpretation: Normal sinus rhythm Normal ECG When compared with ECG of 20-Sep-2019 06:30, Vent. rate has decreased BY  36 BPM Confirmed by Kristeen Miss ([id number]) on 07/04/2023 8:54:08 PM    ASSESSMENT AND PLAN: .     CAD :   doing well after emergent surgery   2.  HTN:    has white coat HTN,  will bring his BP log with him he avoids salty meats.  I encouraged him to work on regular aerobic/cardio exercise.  3.  Hyperlipidemia :    check lipids today   Follow up in 1 year           Dispo: 1 year    Signed, Kristeen Miss, MD

## 2023-07-02 ENCOUNTER — Encounter: Payer: Self-pay | Admitting: Cardiovascular Disease

## 2023-07-02 ENCOUNTER — Ambulatory Visit: Payer: 59 | Attending: Cardiovascular Disease | Admitting: Cardiovascular Disease

## 2023-07-02 VITALS — BP 150/88 | HR 77 | Ht 71.0 in | Wt 213.2 lb

## 2023-07-02 DIAGNOSIS — I25810 Atherosclerosis of coronary artery bypass graft(s) without angina pectoris: Secondary | ICD-10-CM

## 2023-07-02 DIAGNOSIS — E785 Hyperlipidemia, unspecified: Secondary | ICD-10-CM

## 2023-07-02 DIAGNOSIS — Z79899 Other long term (current) drug therapy: Secondary | ICD-10-CM

## 2023-07-02 DIAGNOSIS — I10 Essential (primary) hypertension: Secondary | ICD-10-CM | POA: Diagnosis not present

## 2023-07-02 LAB — BASIC METABOLIC PANEL
BUN/Creatinine Ratio: 16 (ref 10–24)
BUN: 15 mg/dL (ref 8–27)
CO2: 24 mmol/L (ref 20–29)
Calcium: 9.2 mg/dL (ref 8.6–10.2)
Chloride: 104 mmol/L (ref 96–106)
Creatinine, Ser: 0.91 mg/dL (ref 0.76–1.27)
Glucose: 102 mg/dL — ABNORMAL HIGH (ref 70–99)
Potassium: 4.9 mmol/L (ref 3.5–5.2)
Sodium: 140 mmol/L (ref 134–144)
eGFR: 95 mL/min/{1.73_m2} (ref >59)

## 2023-07-02 LAB — ALT: ALT: 33 IU/L (ref 0–44)

## 2023-07-02 NOTE — Patient Instructions (Signed)
 Medication Instructions:  Your physician recommends that you continue on your current medications as directed. Please refer to the Current Medication list given to you today.  *If you need a refill on your cardiac medications before your next appointment, please call your pharmacy*   Lab Work: Today: Lipids, ALT & BMET  If you have any lab test that is abnormal or we need to change your treatment, we will call you to review the results.   Testing/Procedures: None ordered   Follow-Up: At [facility], you and your health needs are our priority.  As part of our continuing mission to provide you with exceptional heart care, we have created designated Provider Care Teams.  These Care Teams include your primary Cardiologist (physician) and Advanced Practice Providers (APPs -  Physician Assistants and Nurse Practitioners) who all work together to provide you with the care you need, when you need it.  Your next appointment:   1 year(s)  The format for your next appointment:   In Person  Provider:   Dr. Elease Hashimoto   Thank you for choosing [facility]!!   [phone]

## 2023-07-07 ENCOUNTER — Encounter: Payer: Self-pay | Admitting: Cardiovascular Disease

## 2023-09-01 ENCOUNTER — Other Ambulatory Visit: Payer: Self-pay | Admitting: Cardiovascular Disease

## 2023-09-14 ENCOUNTER — Other Ambulatory Visit: Payer: Self-pay | Admitting: Cardiovascular Disease

## 2023-09-25 ENCOUNTER — Other Ambulatory Visit: Payer: Self-pay | Admitting: Cardiovascular Disease

## 2023-12-20 ENCOUNTER — Telehealth: Payer: Self-pay | Admitting: Cardiovascular Disease

## 2023-12-20 MED ORDER — LISINOPRIL-HYDROCHLOROTHIAZIDE 10-12.5 MG PO TABS: 1.0000 | ORAL_TABLET | Freq: Every day | ORAL | 1 refills | Status: AC

## 2023-12-20 NOTE — Telephone Encounter (Signed)
*  STAT* If patient is at the pharmacy, call can be transferred to refill team.   1. Which medications need to be refilled? (please list name of each medication and dose if known) lisinopril -hydrochlorothiazide  (ZESTORETIC ) 10-12.5 MG tablet    2. Would you like to learn more about the convenience, safety, & potential cost savings by using the [facility]? No   3. Are you open to using the [facility] (Type [facility]. ) No   4. Which pharmacy/location (including street and city if local pharmacy) is medication to be sent to? [id number] [place], [place] - [address]     5. Do they need a 30 day or 90 day supply? 90 day  Pt out of medication and was seen on 07/02/23

## 2023-12-20 NOTE — Telephone Encounter (Signed)
 RX sent in
